# Patient Record
Sex: Male | Born: 1990 | Race: White | Hispanic: No | Marital: Married | Smoking: Never smoker
Health system: Southern US, Community
[De-identification: ages and names within clinical notes are randomized; demographics above are authoritative.]

## PROBLEM LIST (undated history)

## (undated) DIAGNOSIS — F419 Anxiety disorder, unspecified: Secondary | ICD-10-CM

## (undated) DIAGNOSIS — F32A Depression, unspecified: Secondary | ICD-10-CM

## (undated) DIAGNOSIS — I10 Essential (primary) hypertension: Secondary | ICD-10-CM

## (undated) DIAGNOSIS — E109 Type 1 diabetes mellitus without complications: Secondary | ICD-10-CM

## (undated) DIAGNOSIS — F909 Attention-deficit hyperactivity disorder, unspecified type: Secondary | ICD-10-CM

## (undated) DIAGNOSIS — E119 Type 2 diabetes mellitus without complications: Secondary | ICD-10-CM

## (undated) HISTORY — DX: Anxiety disorder, unspecified: F41.9

## (undated) HISTORY — DX: Attention-deficit hyperactivity disorder, unspecified type: F90.9

## (undated) HISTORY — DX: Depression, unspecified: F32.A

## (undated) HISTORY — DX: Type 1 diabetes mellitus without complications: E10.9

## (undated) HISTORY — DX: Essential (primary) hypertension: I10

---
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
## 2016-09-09 ENCOUNTER — Ambulatory Visit (HOSPITAL_COMMUNITY)
Admission: EM | Admit: 2016-09-09 | Discharge: 2016-09-09 | Disposition: A | Discharge status: Home / Self Care | Payer: PRIVATE HEALTH INSURANCE | Attending: Family Medicine | Admitting: Family Medicine

## 2016-09-09 ENCOUNTER — Ambulatory Visit (INDEPENDENT_AMBULATORY_CARE_PROVIDER_SITE_OTHER): Payer: PRIVATE HEALTH INSURANCE

## 2016-09-09 ENCOUNTER — Encounter (HOSPITAL_COMMUNITY): Payer: Self-pay | Admitting: *Deleted

## 2016-09-09 DIAGNOSIS — S93601A Unspecified sprain of right foot, initial encounter: Secondary | ICD-10-CM

## 2016-09-09 HISTORY — DX: Type 2 diabetes mellitus without complications: E11.9
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
## 2017-06-22 ENCOUNTER — Observation Stay (HOSPITAL_COMMUNITY): Payer: Medicaid Other

## 2017-06-22 ENCOUNTER — Emergency Department (HOSPITAL_COMMUNITY): Payer: Medicaid Other

## 2017-06-22 ENCOUNTER — Inpatient Hospital Stay (HOSPITAL_COMMUNITY)
Admission: EM | Admit: 2017-06-22 | Discharge: 2017-06-24 | DRG: 155 | Disposition: A | Discharge status: Home / Self Care | Payer: Medicaid Other | Attending: General Surgery | Admitting: General Surgery

## 2017-06-22 ENCOUNTER — Encounter (HOSPITAL_COMMUNITY): Payer: Self-pay | Admitting: Emergency Medicine

## 2017-06-22 DIAGNOSIS — M25552 Pain in left hip: Secondary | ICD-10-CM | POA: Diagnosis present

## 2017-06-22 DIAGNOSIS — E109 Type 1 diabetes mellitus without complications: Secondary | ICD-10-CM | POA: Diagnosis present

## 2017-06-22 DIAGNOSIS — S36030A Superficial (capsular) laceration of spleen, initial encounter: Secondary | ICD-10-CM | POA: Diagnosis present

## 2017-06-22 DIAGNOSIS — N179 Acute kidney failure, unspecified: Secondary | ICD-10-CM | POA: Diagnosis present

## 2017-06-22 DIAGNOSIS — S0121XA Laceration without foreign body of nose, initial encounter: Secondary | ICD-10-CM | POA: Diagnosis present

## 2017-06-22 DIAGNOSIS — S022XXA Fracture of nasal bones, initial encounter for closed fracture: Principal | ICD-10-CM | POA: Diagnosis present

## 2017-06-22 DIAGNOSIS — Z794 Long term (current) use of insulin: Secondary | ICD-10-CM

## 2017-06-22 DIAGNOSIS — S01111A Laceration without foreign body of right eyelid and periocular area, initial encounter: Secondary | ICD-10-CM | POA: Diagnosis present

## 2017-06-22 LAB — I-STAT CHEM 8, ED
BUN: 18 mg/dL (ref 6–20)
Calcium, Ion: 1.19 mmol/L (ref 1.15–1.40)
Chloride: 101 mmol/L (ref 101–111)
Creatinine, Ser: 1.2 mg/dL (ref 0.61–1.24)
Glucose, Bld: 83 mg/dL (ref 65–99)
HCT: 45 % (ref 39.0–52.0)
Hemoglobin: 15.3 g/dL (ref 13.0–17.0)
Potassium: 3.5 mmol/L (ref 3.5–5.1)
Sodium: 142 mmol/L (ref 135–145)
TCO2: 29 mmol/L (ref 22–32)

## 2017-06-22 LAB — CBG MONITORING, ED
GLUCOSE-CAPILLARY: 109 mg/dL — AB (ref 65–99)
Glucose-Capillary: 54 mg/dL — ABNORMAL LOW (ref 65–99)
Glucose-Capillary: 60 mg/dL — ABNORMAL LOW (ref 65–99)

## 2017-06-22 LAB — HEMOGLOBIN AND HEMATOCRIT, BLOOD
HCT: 43.1 % (ref 39.0–52.0)
Hemoglobin: 14.5 g/dL (ref 13.0–17.0)

## 2017-06-22 LAB — GLUCOSE, CAPILLARY
GLUCOSE-CAPILLARY: 317 mg/dL — AB (ref 65–99)
Glucose-Capillary: 183 mg/dL — ABNORMAL HIGH (ref 65–99)

## 2017-06-22 MED ORDER — PANTOPRAZOLE SODIUM 40 MG PO TBEC
40.0000 mg | DELAYED_RELEASE_TABLET | Freq: Every day | ORAL | Status: DC
  Administered 2017-06-23 – 2017-06-24 (×2): 40 mg via ORAL
  Filled 2017-06-22 (×2): qty 1

## 2017-06-22 MED ORDER — DEXTROSE 50 % IV SOLN
1.0000 | Freq: Once | INTRAVENOUS | Status: AC
  Administered 2017-06-22: 50 mL via INTRAVENOUS
  Filled 2017-06-22: qty 50

## 2017-06-22 MED ORDER — INSULIN ASPART 100 UNIT/ML ~~LOC~~ SOLN: 0.0000 [IU] | Freq: Three times a day (TID) | SUBCUTANEOUS | Status: DC

## 2017-06-22 MED ORDER — HYDROMORPHONE HCL 1 MG/ML IJ SOLN
1.0000 mg | Freq: Once | INTRAMUSCULAR | Status: AC
  Administered 2017-06-22: 1 mg via INTRAVENOUS
  Filled 2017-06-22: qty 1

## 2017-06-22 MED ORDER — KCL IN DEXTROSE-NACL 20-5-0.45 MEQ/L-%-% IV SOLN
INTRAVENOUS | Status: DC
  Administered 2017-06-22 – 2017-06-23 (×3): via INTRAVENOUS
  Filled 2017-06-22 (×5): qty 1000

## 2017-06-22 MED ORDER — ACETAMINOPHEN 325 MG PO TABS
650.0000 mg | ORAL_TABLET | ORAL | Status: DC | PRN
  Filled 2017-06-22: qty 2

## 2017-06-22 MED ORDER — OXYCODONE HCL 5 MG PO TABS
5.0000 mg | ORAL_TABLET | ORAL | Status: DC | PRN
  Administered 2017-06-22 – 2017-06-24 (×7): 10 mg via ORAL
  Filled 2017-06-22 (×7): qty 2

## 2017-06-22 MED ORDER — TETANUS-DIPHTH-ACELL PERTUSSIS 5-2.5-18.5 LF-MCG/0.5 IM SUSP
0.5000 mL | Freq: Once | INTRAMUSCULAR | Status: AC
  Administered 2017-06-22: 0.5 mL via INTRAMUSCULAR
  Filled 2017-06-22: qty 0.5

## 2017-06-22 MED ORDER — INSULIN ASPART 100 UNIT/ML ~~LOC~~ SOLN
0.0000 [IU] | Freq: Every day | SUBCUTANEOUS | Status: DC
  Administered 2017-06-22: 4 [IU] via SUBCUTANEOUS

## 2017-06-22 MED ORDER — BACITRACIN ZINC 500 UNIT/GM EX OINT
TOPICAL_OINTMENT | Freq: Two times a day (BID) | CUTANEOUS | Status: DC
  Administered 2017-06-22: 22:00:00 via TOPICAL
  Administered 2017-06-23: 1 via TOPICAL
  Administered 2017-06-23 – 2017-06-24 (×2): via TOPICAL
  Filled 2017-06-22 (×2): qty 28.35

## 2017-06-22 MED ORDER — IOPAMIDOL (ISOVUE-300) INJECTION 61%
INTRAVENOUS | Status: AC
  Administered 2017-06-22: 100 mL
  Filled 2017-06-22: qty 100

## 2017-06-22 MED ORDER — HYDROMORPHONE HCL 1 MG/ML IJ SOLN
0.5000 mg | INTRAMUSCULAR | Status: DC | PRN
  Administered 2017-06-22 – 2017-06-23 (×7): 0.5 mg via INTRAVENOUS
  Filled 2017-06-22 (×7): qty 1

## 2017-06-22 MED ORDER — DOCUSATE SODIUM 100 MG PO CAPS
100.0000 mg | ORAL_CAPSULE | Freq: Two times a day (BID) | ORAL | Status: DC
  Administered 2017-06-22 – 2017-06-24 (×4): 100 mg via ORAL
  Filled 2017-06-22 (×4): qty 1

## 2017-06-22 MED ORDER — ONDANSETRON HCL 4 MG/2ML IJ SOLN: 4.0000 mg | Freq: Four times a day (QID) | INTRAMUSCULAR | Status: DC | PRN

## 2017-06-22 MED ORDER — ONDANSETRON 4 MG PO TBDP: 4.0000 mg | ORAL_TABLET | Freq: Four times a day (QID) | ORAL | Status: DC | PRN

## 2017-06-22 MED ORDER — METOPROLOL TARTRATE 5 MG/5ML IV SOLN: 5.0000 mg | Freq: Four times a day (QID) | INTRAVENOUS | Status: DC | PRN

## 2017-06-22 MED ORDER — DEXTROSE 50 % IV SOLN
INTRAVENOUS | Status: AC
  Administered 2017-06-22: 25 mL
  Filled 2017-06-22: qty 50

## 2017-06-22 MED ORDER — LIDOCAINE HCL 2 % IJ SOLN
20.0000 mL | Freq: Once | INTRAMUSCULAR | Status: AC
  Administered 2017-06-22: 400 mg via INTRADERMAL
  Filled 2017-06-22: qty 20

## 2017-06-22 MED ORDER — PANTOPRAZOLE SODIUM 40 MG IV SOLR
40.0000 mg | Freq: Every day | INTRAVENOUS | Status: DC
  Administered 2017-06-22: 40 mg via INTRAVENOUS
  Filled 2017-06-22: qty 40

## 2017-06-22 MED ORDER — INSULIN ASPART 100 UNIT/ML ~~LOC~~ SOLN
0.0000 [IU] | Freq: Three times a day (TID) | SUBCUTANEOUS | Status: DC
  Administered 2017-06-23: 11 [IU] via SUBCUTANEOUS
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
## 2017-06-22 NOTE — ED Notes (Signed)
Dr. Leta Baptisthimmappa w/ ENT to [place] @ 1207 to [phone].

## 2017-06-22 NOTE — ED Notes (Signed)
Dr. Leta Baptisthimmappa w/ ENT repaged to [place] @ 1228 to [phone].

## 2017-06-22 NOTE — ED Notes (Signed)
Patient transported to xray then to 6 N
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
## 2017-06-22 NOTE — Progress Notes (Signed)
Pt asking for some insulin. MD paged requesting orders, no return call received at reporting time
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
## 2017-06-22 NOTE — ED Notes (Signed)
Patient states  He felt like his sugar was dropping , checked glucose 54 Shawn Kemp Lawyer PA aware orders for D50 W 1/2 amp given.
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
## 2017-06-23 DIAGNOSIS — Z794 Long term (current) use of insulin: Secondary | ICD-10-CM | POA: Diagnosis not present

## 2017-06-23 DIAGNOSIS — E109 Type 1 diabetes mellitus without complications: Secondary | ICD-10-CM | POA: Diagnosis present

## 2017-06-23 DIAGNOSIS — S0121XA Laceration without foreign body of nose, initial encounter: Secondary | ICD-10-CM | POA: Diagnosis present

## 2017-06-23 DIAGNOSIS — S01111A Laceration without foreign body of right eyelid and periocular area, initial encounter: Secondary | ICD-10-CM | POA: Diagnosis present

## 2017-06-23 DIAGNOSIS — N179 Acute kidney failure, unspecified: Secondary | ICD-10-CM | POA: Diagnosis present

## 2017-06-23 DIAGNOSIS — S022XXA Fracture of nasal bones, initial encounter for closed fracture: Secondary | ICD-10-CM | POA: Diagnosis not present

## 2017-06-23 DIAGNOSIS — M25552 Pain in left hip: Secondary | ICD-10-CM | POA: Diagnosis present

## 2017-06-23 DIAGNOSIS — S36030A Superficial (capsular) laceration of spleen, initial encounter: Secondary | ICD-10-CM | POA: Diagnosis present

## 2017-06-23 LAB — GLUCOSE, CAPILLARY
GLUCOSE-CAPILLARY: 221 mg/dL — AB (ref 65–99)
GLUCOSE-CAPILLARY: 222 mg/dL — AB (ref 65–99)
Glucose-Capillary: 320 mg/dL — ABNORMAL HIGH (ref 65–99)
Glucose-Capillary: 187 mg/dL — ABNORMAL HIGH (ref 65–99)
Glucose-Capillary: 138 mg/dL — ABNORMAL HIGH (ref 65–99)

## 2017-06-23 LAB — CBC
HEMATOCRIT: 41.8 % (ref 39.0–52.0)
Hemoglobin: 13.9 g/dL (ref 13.0–17.0)
MCH: 29.1 pg (ref 26.0–34.0)
MCHC: 33.3 g/dL (ref 30.0–36.0)
MCV: 87.4 fL (ref 78.0–100.0)
Platelets: 178 10*3/uL (ref 150–400)
RBC: 4.78 MIL/uL (ref 4.22–5.81)
RDW: 12.8 % (ref 11.5–15.5)
WBC: 8.8 10*3/uL (ref 4.0–10.5)

## 2017-06-23 LAB — BASIC METABOLIC PANEL
Anion gap: 10 (ref 5–15)
BUN: 9 mg/dL (ref 6–20)
CALCIUM: 8.9 mg/dL (ref 8.9–10.3)
CO2: 27 mmol/L (ref 22–32)
Chloride: 96 mmol/L — ABNORMAL LOW (ref 101–111)
Creatinine, Ser: 1.3 mg/dL — ABNORMAL HIGH (ref 0.61–1.24)
Glucose, Bld: 314 mg/dL — ABNORMAL HIGH (ref 65–99)
POTASSIUM: 4.1 mmol/L (ref 3.5–5.1)
Sodium: 133 mmol/L — ABNORMAL LOW (ref 135–145)

## 2017-06-23 LAB — HIV ANTIBODY (ROUTINE TESTING W REFLEX): HIV SCREEN 4TH GENERATION: NONREACTIVE

## 2017-06-23 MED ORDER — INSULIN GLARGINE 100 UNIT/ML ~~LOC~~ SOLN
26.0000 [IU] | Freq: Every day | SUBCUTANEOUS | Status: DC
  Administered 2017-06-23 – 2017-06-24 (×2): 26 [IU] via SUBCUTANEOUS
  Filled 2017-06-23 (×2): qty 0.26

## 2017-06-23 MED ORDER — INSULIN ASPART 100 UNIT/ML ~~LOC~~ SOLN
0.0000 [IU] | Freq: Three times a day (TID) | SUBCUTANEOUS | Status: DC
  Administered 2017-06-23: 3 [IU] via SUBCUTANEOUS
  Administered 2017-06-23: 2 [IU] via SUBCUTANEOUS
  Administered 2017-06-24: 5 [IU] via SUBCUTANEOUS

## 2017-06-23 MED ORDER — INSULIN ASPART 100 UNIT/ML ~~LOC~~ SOLN
4.0000 [IU] | Freq: Three times a day (TID) | SUBCUTANEOUS | Status: DC
  Administered 2017-06-23 – 2017-06-24 (×3): 4 [IU] via SUBCUTANEOUS

## 2017-06-23 NOTE — Progress Notes (Signed)
Inpatient Diabetes Program Recommendations  AACE/ADA: New Consensus Statement on Inpatient Glycemic Control (2015)  Target Ranges:  Prepandial:   less than 140 mg/dL      Peak postprandial:   less than 180 mg/dL (1-2 hours)      Critically ill patients:  140 - 180 mg/dL   Lab Results  Component Value Date   GLUCAP 320 (H) 06/23/2017    Review of Glycemic Control Results for Shawn Kemp, Shawn Kemp (MRN [id number]) as of 06/23/2017 10:00  Ref. Range 06/22/2017 09:10 06/22/2017 13:20 06/22/2017 17:59 06/22/2017 21:29 06/23/2017 07:50  Glucose-Capillary Latest Ref Range: 65 - 99 mg/dL 60 (Kemp) 409109 (H) 811183 (H) 317 (H) 320 (H)   Diabetes history: DM1 Outpatient Diabetes medications: Novolin 70/30 insulin mix 25 units ac breakfast + 15-25 units ac dinner Current orders for Inpatient glycemic control: Novolog Moderate scale tid + hs 0-5 units  Inpatient Diabetes Program Recommendations:   Spoke with patient and mom  @ bedside. Patient is type 1 and needs basal insulin. Patient does not currently have insurance and does not have a PCP either. Spoke with Wells GuilesKelly Rayburn, Trauma PA to discuss orders for patient. Plans to start Lantus 26 units qd Novolog 4 units tid meal coverage if eats 50% Decrease current Novolog correction scale to sensitive Case management [facility] needs. Patient states he normally takes his 70/30 insulin prior to driving to work and eating on his way to work. Recommend hospitalist consult.   Thank you, Billy FischerJudy E. Iram Astorino, RN, MSN, CDE  Diabetes Coordinator Inpatient Glycemic Control Team Team Pager [phone] (8am-5pm) 06/23/2017 10:11 AM
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
## 2017-06-23 NOTE — Progress Notes (Signed)
Tech offered the patient a bath and he stated he would like to wait until later on, maybe around noon, to wash up and just wants to rest currently. Tech will go back around noon to check in again.

## 2017-06-24 LAB — CBC
HCT: 41.4 % (ref 39.0–52.0)
HEMOGLOBIN: 14.2 g/dL (ref 13.0–17.0)
MCH: 29.9 pg (ref 26.0–34.0)
MCHC: 34.3 g/dL (ref 30.0–36.0)
MCV: 87.2 fL (ref 78.0–100.0)
PLATELETS: 155 10*3/uL (ref 150–400)
RBC: 4.75 MIL/uL (ref 4.22–5.81)
RDW: 12.8 % (ref 11.5–15.5)
WBC: 8.5 10*3/uL (ref 4.0–10.5)

## 2017-06-24 LAB — GLUCOSE, CAPILLARY: GLUCOSE-CAPILLARY: 288 mg/dL — AB (ref 65–99)

## 2017-06-24 MED ORDER — OXYCODONE HCL 10 MG PO TABS: 10.0000 mg | ORAL_TABLET | ORAL | 0 refills | Status: DC | PRN

## 2017-06-24 MED ORDER — ACETAMINOPHEN 325 MG PO TABS: 650.0000 mg | ORAL_TABLET | Freq: Four times a day (QID) | ORAL | Status: DC
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
## 2017-06-24 NOTE — Discharge Instructions (Signed)
Splenic Injury A splenic injury is an injury of the spleen. The spleen is an organ located in the upper left area of your abdomen, just under your ribs. Your spleen filters and cleans your blood. It also stores blood cells and destroys cells that are worn out. Your spleen is also important for fighting disease. Splenic injuries can vary. In some cases, the spleen may only be bruised with some bleeding inside the covering and around the spleen. Splenic injuries may also cause a deep tear or cut into the spleen (lacerated spleen). Some splenic injuries can cause the spleen to break open (rupture). What are the causes? Splenic injuries can be caused by a direct blow (blunt trauma) from:  Car accidents.  Contact sports.  Falls.  Gunshot wounds or knife wounds (penetrating injuries) can also cause a splenic injury. What increases the risk? You may be at greater risk for a splenic injury if you have a disease that can cause the spleen to become enlarged. These include:  Alcoholic liver disease.  Viral infections, especially mononucleosis.  What are the signs or symptoms? A minor splenic injury often causes no symptoms or only minor abdominal pain. If the injury causes severe bleeding, your blood pressure may rapidly decrease. This may cause:  Dizziness or light-headedness.  Rapid heart rate.  Difficulty breathing.  Fainting.  Sweating with clammy skin.  Other signs and symptoms of a splenic injury can include:  Very bad abdominal pain.  Pain in the left shoulder.  Pain when the abdomen is pressed (tenderness).  Nausea.  Swelling or bruising of the abdomen.  How is this diagnosed? Your health care provider may suspect a splenic injury based on your signs and symptoms, especially if you were recently in an accident or you recently got hurt. Your health care provider will do a physical exam. Imaging tests may be done to confirm the diagnosis. These may  include:  Ultrasound.  CT scan.  You may have frequent blood tests for a few days after the injury to monitor your condition. How is this treated? Treatment depends on the type of splenic injury you have and how bad it is. Your health care provider will develop a treatment plan specific to your needs.  Less severe injuries may be treated with: ? Observation. ? Interventional radiology. This involves using flexible tubes (catheters) to stop the bleeding from inside the blood vessel.  More severe injuries may require hospitalization in the intensive care unit (ICU). While you are in the ICU: ? Your fluid and blood levels will be monitored closely. ? You will get fluids through an IV tube as needed. ? You may need follow-up scans to check whether your spleen is able to heal itself. If the injury is getting worse, you may need surgery. ? You may receive donated blood (transfusion). ? You may have a long needle inserted into your abdomen to remove any blood that has collected inside the spleen (hematoma).  Surgery. If your blood pressure is too low, you may need emergency surgery. This may include: ? Repairing a laceration. ? Removing part of the spleen. ? Removing the entire spleen (splenectomy).  Follow these instructions at home:  Take medicines only as directed by your health care provider.  Rest at home.  Do not participate in any strenuous activity until your health care provider says it is safe to do so.  Do not lift anything that is heavier than 10 lb (4.5 kg).  Do not participate in contact sports  until your health care provider says it is safe to do so.  Stay up-to-date on vaccinations as told by your health care provider. Contact a health care provider if:  You have a fever.  You have new or increasing pain in your abdomen or in your left shoulder. Get help right away if:  You have signs or symptoms of internal bleeding. Watch  for: ? Sweating. ? Dizziness. ? Weakness. ? Cold and clammy skin. ? Fainting.  You have chest pain or difficulty breathing. This information is not intended to replace advice given to you by your health care provider. Make sure you discuss any questions you have with your health care provider. Document Released: 04/26/2006 Document Revised: 03/02/2016 Document Reviewed: 03/20/2014 Elsevier Interactive Patient Education  2017 [employer].  1. PAIN CONTROL:  1. Pain is best controlled by a usual combination of three different methods TOGETHER:  1. Ice/Heat 2. Over the counter pain medication 3. Prescription pain medication 2. Most patients will experience some swelling and bruising around wounds. Ice packs or heating pads (30-60 minutes up to 6 times a day) will help. Use ice for the first few days to help decrease swelling and bruising, then switch to heat to help relax tight/sore spots and speed recovery. Some people prefer to use ice alone, heat alone, alternating between ice & heat. Experiment to what works for you. Swelling and bruising can take several weeks to resolve.  3. It is helpful to take an over-the-counter pain medication regularly for the first few weeks. Choose one of the following that works best for you:  1. Naproxen (Aleve, etc) Two 220mg  tabs twice a day 2. Ibuprofen (Advil, etc) Three 200mg  tabs four times a day (every meal & bedtime) 3. Acetaminophen (Tylenol, etc) 500-650mg  four times a day (every meal & bedtime) 4. A prescription for pain medication (such as oxycodone, hydrocodone, etc) should be given to you upon discharge. Take your pain medication as prescribed.  1. If you are having problems/concerns with the prescription medicine (does not control pain, nausea, vomiting, rash, itching, etc), please call us [phone] to see if we need to switch you to a different pain medicine that will work better for you and/or control your side effect better. 2. If you  need a refill on your pain medication, please contact your pharmacy. They will contact our office to request authorization. Prescriptions will not be filled after 5 pm or on week-ends. 4. Avoid getting constipated. When taking pain medications, it is common to experience some constipation. Increasing fluid intake and taking a fiber supplement (such as Metamucil, Citrucel, FiberCon, MiraLax, etc) 1-2 times a day regularly will usually help prevent this problem from occurring. A mild laxative (prune juice, Milk of Magnesia, MiraLax, etc) should be taken according to package directions if there are no bowel movements after 48 hours.  5. Watch out for diarrhea. If you have many loose bowel movements, simplify your diet to bland foods & liquids for a few days. Stop any stool softeners and decrease your fiber supplement. Switching to mild anti-diarrheal medications (Kayopectate, Pepto Bismol) can help. If this worsens or does not improve, please call us. 6. Wash / shower every day. You may shower daily and replace your bandges after showering. No bathing or submerging your wounds in water until they heal. 7. FOLLOW UP in our office  1. Please call [facility] at [phone] as needed with any questions or concerns  WHEN TO CALL us [phone]:  1.  Poor pain control 2. Reactions / problems with new medications (rash/itching, nausea, etc)  3. Fever over 101.5 F (38.5 C) 4. Worsening swelling or bruising 5. Continued bleeding from wounds. 6. Increased pain, redness, or drainage from the wounds which could be signs of infection  The clinic staff is available to answer your questions during regular business hours (8:30am-5pm). Please dont hesitate to call and ask to speak to one of our nurses for clinical concerns.  If you have a medical emergency, go to the nearest emergency room or call 911.  A surgeon from [facility] is always on call at the [facility]   [facility] [place]  [address], [address], [place], [place] [address] ?  MAIN: [phone] ? TOLL FREE: [phone] ?  FAX [phone]  [email or web address]  Wash your face daily and apply bacitracin after.   Sutured Wound Care Sutures are stitches that can be used to close wounds. Taking care of your wound properly can help to prevent pain and infection. It can also help your wound to heal more quickly. How is this treated? Wound Care  Keep the wound clean and dry.  If you were given a bandage (dressing), you should change it at least once per day or as directed by your health care provider. You should also change it if it becomes wet or dirty.  Keep the wound completely dry for the first 24 hours or as directed by your health care provider. After that time, you may shower or bathe. However, make sure that the wound is not soaked in water until the sutures have been removed.  Clean the wound one time each day or as directed by your health care provider. ? Wash the wound with soap and water. ? Rinse the wound with water to remove all soap. ? Pat the wound dry with a clean towel. Do not rub the wound.  Aftercleaning the wound, apply a thin layer of antibioticointment as directed by your health care provider. This will help to prevent infection and keep the dressing from sticking to the wound.  Have the sutures removed as directed by your health care provider. General Instructions  Take or apply medicines only as directed by your health care provider.  To help prevent scarring, make sure to cover your wound with sunscreen whenever you are outside after the sutures are removed and the wound is healed. Make sure to wear a sunscreen of at least 30 SPF.  If you were prescribed an antibiotic medicine or ointment, finish all of it even if you start to feel better.  Do not scratch or pick at the wound.  Keep all follow-up visits as directed by your health care provider. This is  important.  Check your wound every day for signs of infection. Watch for: ? Redness, swelling, or pain. ? Fluid, blood, or pus.  Raise (elevate) the injured area above the level of your heart while you are sitting or lying down, if possible.  Avoid stretching your wound.  Drink enough fluids to keep your urine clear or pale yellow. Contact a health care provider if:  You received a tetanus shot and you have swelling, severe pain, redness, or bleeding at the injection site.  You have a fever.  A wound that was closed breaks open.  You notice a bad smell coming from the wound.  You notice something coming out of the wound, such as wood or glass.  Your pain is not controlled  with medicine.  You have increased redness, swelling, or pain at the site of your wound.  You have fluid, blood, or pus coming from your wound.  You notice a change in the color of your skin near your wound.  You need to change the dressing frequently due to fluid, blood, or pus draining from the wound.  You develop a new rash.  You develop numbness around the wound. Get help right away if:  You develop severe swelling around the injury site.  Your pain suddenly increases and is severe.  You develop painful lumps near the wound or on skin that is anywhere on your body.  You have a red streak going away from your wound.  The wound is on your hand or foot and you cannot properly move a finger or toe.  The wound is on your hand or foot and you notice that your fingers or toes look pale or bluish. This information is not intended to replace advice given to you by your health care provider. Make sure you discuss any questions you have with your health care provider. Document Released: 08/12/2004 Document Revised: 12/11/2015 Document Reviewed: 02/14/2013 Elsevier Interactive Patient Education  2017 [employer]

## 2017-06-24 NOTE — Progress Notes (Signed)
Patient discharged to home with instructions and prescription. 

## 2017-06-24 NOTE — Progress Notes (Signed)
Inpatient Diabetes Program Recommendations  AACE/ADA: New Consensus Statement on Inpatient Glycemic Control (2015)  Target Ranges:  Prepandial:   less than 140 mg/dL      Peak postprandial:   less than 180 mg/dL (1-2 hours)      Critically ill patients:  140 - 180 mg/dL   Lab Results  Component Value Date   GLUCAP 288 (H) 06/24/2017    Review of Glycemic Control  Inpatient Diabetes Program Recommendations:   Reviewed CBGs. Please increase: -Lantus to 30 units daily -Novolog meal coverage to 5 units tid if eats 50%  Thank you, Darel HongJudy E. Peggie Hornak, RN, MSN, CDE  Diabetes Coordinator Inpatient Glycemic Control Team Team Pager [phone] (8am-5pm) 06/24/2017 8:35 AM]
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
## 2019-11-22 DIAGNOSIS — I10 Essential (primary) hypertension: Secondary | ICD-10-CM | POA: Diagnosis not present

## 2019-11-22 DIAGNOSIS — E109 Type 1 diabetes mellitus without complications: Secondary | ICD-10-CM | POA: Diagnosis not present

## 2019-11-22 DIAGNOSIS — Z6834 Body mass index (BMI) 34.0-34.9, adult: Secondary | ICD-10-CM | POA: Diagnosis not present

## 2019-11-22 DIAGNOSIS — E7849 Other hyperlipidemia: Secondary | ICD-10-CM | POA: Diagnosis not present

## 2020-01-31 DIAGNOSIS — E7849 Other hyperlipidemia: Secondary | ICD-10-CM | POA: Diagnosis not present

## 2020-01-31 DIAGNOSIS — I10 Essential (primary) hypertension: Secondary | ICD-10-CM | POA: Diagnosis not present

## 2020-01-31 DIAGNOSIS — E109 Type 1 diabetes mellitus without complications: Secondary | ICD-10-CM | POA: Diagnosis not present

## 2020-03-13 ENCOUNTER — Ambulatory Visit: Payer: PRIVATE HEALTH INSURANCE | Admitting: Orthopaedic Surgery

## 2020-03-13 ENCOUNTER — Other Ambulatory Visit: Payer: Self-pay

## 2020-03-26 ENCOUNTER — Ambulatory Visit: Payer: Self-pay | Admitting: "Endocrinology

## 2020-04-23 DIAGNOSIS — E7849 Other hyperlipidemia: Secondary | ICD-10-CM | POA: Diagnosis not present

## 2020-04-23 DIAGNOSIS — Z6832 Body mass index (BMI) 32.0-32.9, adult: Secondary | ICD-10-CM | POA: Diagnosis not present

## 2020-04-23 DIAGNOSIS — E109 Type 1 diabetes mellitus without complications: Secondary | ICD-10-CM | POA: Diagnosis not present

## 2020-04-23 DIAGNOSIS — I10 Essential (primary) hypertension: Secondary | ICD-10-CM | POA: Diagnosis not present

## 2020-04-25 DIAGNOSIS — E109 Type 1 diabetes mellitus without complications: Secondary | ICD-10-CM | POA: Diagnosis not present

## 2020-04-25 DIAGNOSIS — Z79899 Other long term (current) drug therapy: Secondary | ICD-10-CM | POA: Diagnosis not present

## 2020-04-25 DIAGNOSIS — E7849 Other hyperlipidemia: Secondary | ICD-10-CM | POA: Diagnosis not present

## 2020-05-22 DIAGNOSIS — Z0000 Encounter for general adult medical examination without abnormal findings: Secondary | ICD-10-CM | POA: Diagnosis not present

## 2020-05-22 DIAGNOSIS — Z6831 Body mass index (BMI) 31.0-31.9, adult: Secondary | ICD-10-CM | POA: Diagnosis not present

## 2020-09-09 DIAGNOSIS — E109 Type 1 diabetes mellitus without complications: Secondary | ICD-10-CM | POA: Diagnosis not present

## 2020-09-09 DIAGNOSIS — Z0000 Encounter for general adult medical examination without abnormal findings: Secondary | ICD-10-CM | POA: Diagnosis not present

## 2020-09-09 DIAGNOSIS — M25519 Pain in unspecified shoulder: Secondary | ICD-10-CM | POA: Diagnosis not present

## 2020-09-09 DIAGNOSIS — I10 Essential (primary) hypertension: Secondary | ICD-10-CM | POA: Diagnosis not present

## 2020-09-09 DIAGNOSIS — E7849 Other hyperlipidemia: Secondary | ICD-10-CM | POA: Diagnosis not present

## 2020-10-02 ENCOUNTER — Other Ambulatory Visit: Payer: Self-pay

## 2020-10-02 ENCOUNTER — Ambulatory Visit (INDEPENDENT_AMBULATORY_CARE_PROVIDER_SITE_OTHER): Payer: BC Managed Care – PPO

## 2020-10-02 ENCOUNTER — Ambulatory Visit (INDEPENDENT_AMBULATORY_CARE_PROVIDER_SITE_OTHER): Payer: BC Managed Care – PPO | Admitting: Orthopaedic Surgery

## 2020-10-02 DIAGNOSIS — M25512 Pain in left shoulder: Secondary | ICD-10-CM | POA: Diagnosis not present
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
## 2020-11-16 DIAGNOSIS — E119 Type 2 diabetes mellitus without complications: Secondary | ICD-10-CM | POA: Diagnosis not present

## 2020-11-16 DIAGNOSIS — H40033 Anatomical narrow angle, bilateral: Secondary | ICD-10-CM | POA: Diagnosis not present

## 2020-12-04 ENCOUNTER — Telehealth: Payer: Self-pay | Admitting: Radiology

## 2020-12-04 DIAGNOSIS — M25512 Pain in left shoulder: Secondary | ICD-10-CM

## 2020-12-04 NOTE — Telephone Encounter (Signed)
PT referral entered and faxed. I left voicemail for patient advising.

## 2020-12-04 NOTE — Telephone Encounter (Signed)
Yes proceed thanks

## 2020-12-04 NOTE — Telephone Encounter (Signed)
Patient was going to be referred to PT in March for subscap strengthening.  He called [facility] office today stating that he was never referred and would like referral to go to [facility] in [place]. OK for referral?

## 2020-12-08 DIAGNOSIS — M6281 Muscle weakness (generalized): Secondary | ICD-10-CM | POA: Diagnosis not present

## 2020-12-08 DIAGNOSIS — S43082A Other subluxation of left shoulder joint, initial encounter: Secondary | ICD-10-CM | POA: Diagnosis not present

## 2020-12-11 ENCOUNTER — Telehealth: Payer: Self-pay | Admitting: Radiology

## 2020-12-11 NOTE — Telephone Encounter (Signed)
Received call from [place], [place]. She received order for subscap strengthening of shoulder and wanted to see if you wanted over all shoulder strengthening as well. Office note only states subscap strengthening so I did not give verbal for overall shoulder strengthening.  Misty Stanley aware you are out of the office until next week, and asks that we return call then in regards to order.  OK for shoulder strengthening as well?  CB for [place] [phone]

## 2020-12-16 NOTE — Telephone Encounter (Signed)
I called discussed.  

## 2021-01-07 DIAGNOSIS — M25519 Pain in unspecified shoulder: Secondary | ICD-10-CM | POA: Diagnosis not present

## 2021-01-07 DIAGNOSIS — E1121 Type 2 diabetes mellitus with diabetic nephropathy: Secondary | ICD-10-CM | POA: Diagnosis not present

## 2021-01-07 DIAGNOSIS — E1169 Type 2 diabetes mellitus with other specified complication: Secondary | ICD-10-CM | POA: Diagnosis not present

## 2021-01-07 DIAGNOSIS — I10 Essential (primary) hypertension: Secondary | ICD-10-CM | POA: Diagnosis not present

## 2021-02-21 DIAGNOSIS — M10062 Idiopathic gout, left knee: Secondary | ICD-10-CM | POA: Diagnosis not present

## 2021-02-21 DIAGNOSIS — M25462 Effusion, left knee: Secondary | ICD-10-CM | POA: Diagnosis not present

## 2021-03-09 DIAGNOSIS — E1121 Type 2 diabetes mellitus with diabetic nephropathy: Secondary | ICD-10-CM | POA: Diagnosis not present

## 2021-03-20 DIAGNOSIS — E1121 Type 2 diabetes mellitus with diabetic nephropathy: Secondary | ICD-10-CM | POA: Diagnosis not present

## 2021-03-20 DIAGNOSIS — M25519 Pain in unspecified shoulder: Secondary | ICD-10-CM | POA: Diagnosis not present

## 2021-03-20 DIAGNOSIS — I10 Essential (primary) hypertension: Secondary | ICD-10-CM | POA: Diagnosis not present

## 2021-03-20 DIAGNOSIS — E7849 Other hyperlipidemia: Secondary | ICD-10-CM | POA: Diagnosis not present

## 2021-03-31 DIAGNOSIS — S43421A Sprain of right rotator cuff capsule, initial encounter: Secondary | ICD-10-CM | POA: Diagnosis not present

## 2021-04-09 DIAGNOSIS — R1013 Epigastric pain: Secondary | ICD-10-CM | POA: Diagnosis not present

## 2021-05-28 DIAGNOSIS — Z6827 Body mass index (BMI) 27.0-27.9, adult: Secondary | ICD-10-CM | POA: Diagnosis not present

## 2021-05-28 DIAGNOSIS — Z0000 Encounter for general adult medical examination without abnormal findings: Secondary | ICD-10-CM | POA: Diagnosis not present

## 2021-07-06 DIAGNOSIS — J029 Acute pharyngitis, unspecified: Secondary | ICD-10-CM | POA: Diagnosis not present

## 2021-07-18 DIAGNOSIS — H04123 Dry eye syndrome of bilateral lacrimal glands: Secondary | ICD-10-CM | POA: Diagnosis not present

## 2021-07-18 DIAGNOSIS — H40033 Anatomical narrow angle, bilateral: Secondary | ICD-10-CM | POA: Diagnosis not present

## 2021-09-09 DIAGNOSIS — S233XXA Sprain of ligaments of thoracic spine, initial encounter: Secondary | ICD-10-CM | POA: Diagnosis not present

## 2021-09-09 DIAGNOSIS — S338XXA Sprain of other parts of lumbar spine and pelvis, initial encounter: Secondary | ICD-10-CM | POA: Diagnosis not present

## 2021-09-09 DIAGNOSIS — S134XXA Sprain of ligaments of cervical spine, initial encounter: Secondary | ICD-10-CM | POA: Diagnosis not present

## 2021-09-11 DIAGNOSIS — S134XXA Sprain of ligaments of cervical spine, initial encounter: Secondary | ICD-10-CM | POA: Diagnosis not present

## 2021-09-11 DIAGNOSIS — S338XXA Sprain of other parts of lumbar spine and pelvis, initial encounter: Secondary | ICD-10-CM | POA: Diagnosis not present

## 2021-09-11 DIAGNOSIS — S233XXA Sprain of ligaments of thoracic spine, initial encounter: Secondary | ICD-10-CM | POA: Diagnosis not present

## 2021-11-06 DIAGNOSIS — I10 Essential (primary) hypertension: Secondary | ICD-10-CM | POA: Diagnosis not present

## 2021-11-06 DIAGNOSIS — Z0000 Encounter for general adult medical examination without abnormal findings: Secondary | ICD-10-CM | POA: Diagnosis not present

## 2021-11-06 DIAGNOSIS — R5383 Other fatigue: Secondary | ICD-10-CM | POA: Diagnosis not present

## 2021-11-06 LAB — CBC AND DIFFERENTIAL
HCT: 44 (ref 41–53)
Hemoglobin: 15 (ref 13.5–17.5)
Platelets: 200 10*3/uL (ref 150–400)
WBC: 5.3

## 2021-11-06 LAB — LIPID PANEL
Cholesterol: 192 (ref 0–200)
HDL: 49 (ref 35–70)
LDL Cholesterol: 133
Triglycerides: 55 (ref 40–160)

## 2021-11-06 LAB — COMPREHENSIVE METABOLIC PANEL
Albumin: 4.5 (ref 3.5–5.0)
Calcium: 9.5 (ref 8.7–10.7)
Globulin: 2.3
eGFR: 95

## 2021-11-06 LAB — BASIC METABOLIC PANEL
BUN: 20 (ref 4–21)
CO2: 22 (ref 13–22)
Chloride: 104 (ref 99–108)
Creatinine: 1.1 (ref 0.6–1.3)
Glucose: 157
Potassium: 4.6 mEq/L (ref 3.5–5.1)
Sodium: 140 (ref 137–147)

## 2021-11-06 LAB — VITAMIN D 25 HYDROXY (VIT D DEFICIENCY, FRACTURES): Vit D, 25-Hydroxy: 33.9

## 2021-11-06 LAB — CBC: RBC: 4.98 (ref 3.87–5.11)

## 2021-11-06 LAB — HEPATIC FUNCTION PANEL
ALT: 35 U/L (ref 10–40)
AST: 39 (ref 14–40)
Alkaline Phosphatase: 100 (ref 25–125)

## 2021-11-06 LAB — TSH: TSH: 1.06 (ref 0.41–5.90)

## 2021-11-06 LAB — HEMOGLOBIN A1C: Hemoglobin A1C: 7.6

## 2021-12-22 DIAGNOSIS — E1165 Type 2 diabetes mellitus with hyperglycemia: Secondary | ICD-10-CM | POA: Diagnosis not present

## 2022-03-07 DIAGNOSIS — R1084 Generalized abdominal pain: Secondary | ICD-10-CM | POA: Diagnosis not present

## 2022-03-07 DIAGNOSIS — K5900 Constipation, unspecified: Secondary | ICD-10-CM | POA: Diagnosis not present

## 2022-03-07 DIAGNOSIS — R300 Dysuria: Secondary | ICD-10-CM | POA: Diagnosis not present

## 2022-03-10 DIAGNOSIS — E1165 Type 2 diabetes mellitus with hyperglycemia: Secondary | ICD-10-CM | POA: Diagnosis not present

## 2022-03-20 DIAGNOSIS — Z0000 Encounter for general adult medical examination without abnormal findings: Secondary | ICD-10-CM | POA: Diagnosis not present

## 2022-03-20 DIAGNOSIS — E1165 Type 2 diabetes mellitus with hyperglycemia: Secondary | ICD-10-CM | POA: Diagnosis not present

## 2022-03-20 LAB — BASIC METABOLIC PANEL
BUN: 22 — AB (ref 4–21)
CO2: 27 — AB (ref 13–22)
Chloride: 103 (ref 99–108)
Creatinine: 1.4 — AB (ref 0.6–1.3)
Glucose: 216
Potassium: 4.4 mEq/L (ref 3.5–5.1)
Sodium: 136 — AB (ref 137–147)

## 2022-03-20 LAB — COMPREHENSIVE METABOLIC PANEL
Albumin: 4.7 (ref 3.5–5.0)
Calcium: 9.8 (ref 8.7–10.7)
Globulin: 2.4
eGFR: 10

## 2022-03-20 LAB — HEPATIC FUNCTION PANEL
ALT: 37 U/L (ref 10–40)
AST: 32 (ref 14–40)

## 2022-03-20 LAB — HEMOGLOBIN A1C: Hemoglobin A1C: 6.7

## 2022-03-26 DIAGNOSIS — E1165 Type 2 diabetes mellitus with hyperglycemia: Secondary | ICD-10-CM | POA: Diagnosis not present

## 2022-03-31 DIAGNOSIS — S233XXA Sprain of ligaments of thoracic spine, initial encounter: Secondary | ICD-10-CM | POA: Diagnosis not present

## 2022-03-31 DIAGNOSIS — S338XXA Sprain of other parts of lumbar spine and pelvis, initial encounter: Secondary | ICD-10-CM | POA: Diagnosis not present

## 2022-03-31 DIAGNOSIS — S134XXA Sprain of ligaments of cervical spine, initial encounter: Secondary | ICD-10-CM | POA: Diagnosis not present

## 2022-04-01 DIAGNOSIS — S338XXA Sprain of other parts of lumbar spine and pelvis, initial encounter: Secondary | ICD-10-CM | POA: Diagnosis not present

## 2022-04-01 DIAGNOSIS — S134XXA Sprain of ligaments of cervical spine, initial encounter: Secondary | ICD-10-CM | POA: Diagnosis not present

## 2022-04-01 DIAGNOSIS — S233XXA Sprain of ligaments of thoracic spine, initial encounter: Secondary | ICD-10-CM | POA: Diagnosis not present

## 2022-04-02 DIAGNOSIS — S233XXA Sprain of ligaments of thoracic spine, initial encounter: Secondary | ICD-10-CM | POA: Diagnosis not present

## 2022-04-02 DIAGNOSIS — S338XXA Sprain of other parts of lumbar spine and pelvis, initial encounter: Secondary | ICD-10-CM | POA: Diagnosis not present

## 2022-04-02 DIAGNOSIS — S134XXA Sprain of ligaments of cervical spine, initial encounter: Secondary | ICD-10-CM | POA: Diagnosis not present

## 2022-07-02 DIAGNOSIS — E1165 Type 2 diabetes mellitus with hyperglycemia: Secondary | ICD-10-CM | POA: Diagnosis not present

## 2022-09-16 ENCOUNTER — Encounter: Payer: Self-pay | Admitting: Adult Health

## 2022-09-16 ENCOUNTER — Ambulatory Visit (INDEPENDENT_AMBULATORY_CARE_PROVIDER_SITE_OTHER): Payer: Medicaid Other | Admitting: Adult Health

## 2022-09-16 VITALS — BP 135/78 | HR 78 | Temp 98.0°F | Resp 18 | Ht 67.0 in | Wt 198.1 lb

## 2022-09-16 DIAGNOSIS — E109 Type 1 diabetes mellitus without complications: Secondary | ICD-10-CM | POA: Diagnosis not present

## 2022-09-16 DIAGNOSIS — F419 Anxiety disorder, unspecified: Secondary | ICD-10-CM | POA: Diagnosis not present

## 2022-09-16 DIAGNOSIS — I10 Essential (primary) hypertension: Secondary | ICD-10-CM

## 2022-09-16 MED ORDER — DEXCOM G6 SENSOR MISC: 3 refills | Status: DC

## 2022-09-16 MED ORDER — DEXCOM G6 TRANSMITTER MISC: 1.0000 | 3 refills | Status: DC

## 2022-09-16 MED ORDER — NOVOLOG 100 UNIT/ML IJ SOLN: 10.0000 [IU] | Freq: Three times a day (TID) | INTRAMUSCULAR | 3 refills | Status: DC

## 2022-09-16 MED ORDER — BUPROPION HCL 75 MG PO TABS: 75.0000 mg | ORAL_TABLET | Freq: Every day | ORAL | 3 refills | Status: DC

## 2022-09-16 MED ORDER — BENAZEPRIL HCL 40 MG PO TABS: 40.0000 mg | ORAL_TABLET | Freq: Every day | ORAL | 3 refills | Status: DC

## 2022-09-16 MED ORDER — SEMGLEE (YFGN) 100 UNIT/ML ~~LOC~~ SOLN: 25.0000 [IU] | Freq: Every day | SUBCUTANEOUS | 3 refills | Status: DC

## 2022-09-16 NOTE — Progress Notes (Signed)
[facility]  Provider: Durenda Age DNP  Code Status:  Full Code  Goals of Care:      No data to display           Chief Complaint  Patient presents with   Acute Visit    Medication refill. Patient states took blood sugar been high around 300's    HPI: Patient is a 32 y.o. male seen today for an acute visit for diabetic medication refills. He was diagnosed with IDDM since age 36 years old. He is wanting prescription for his medications.   Insulin dependent diabetes mellitus type IA ([place]) - takes Semeglee and Novolog  Essential hypertension - takes Lotensin, BP 135/78  Anxiety - takes Wellbutrin   Past Medical History:  Diagnosis Date   Diabetes mellitus without complication ([place])     History reviewed. No pertinent surgical history.  No Known Allergies  Outpatient Encounter Medications as of 09/16/2022  Medication Sig   acetaminophen (TYLENOL) 325 MG tablet Take 2 tablets (650 mg total) by mouth every 6 (six) hours.   benazepril (LOTENSIN) 40 MG tablet Take 40 mg by mouth daily.   buPROPion (WELLBUTRIN) 75 MG tablet Take 75 mg by mouth daily.   Continuous Blood Gluc Sensor (DEXCOM G6 SENSOR) MISC SMARTSIG:Topical Every 10 Days   Continuous Blood Gluc Transmit (DEXCOM G6 TRANSMITTER) MISC See admin instructions.   insulin NPH-regular Human (NOVOLIN 70/30) (70-30) 100 UNIT/ML injection Inject 25 Units into the skin daily with breakfast.    NOVOLOG 100 UNIT/ML injection SMARTSIG:5-20 Unit(s) SUB-Q 3 Times Daily   oxyCODONE 10 MG TABS Take 1 tablet (10 mg total) by mouth every 4 (four) hours as needed for moderate pain or severe pain.   RELION INSULIN SYRINGE 1ML/31G 31G X 5/16" 1 ML MISC See admin instructions.   SEMGLEE, YFGN, 100 UNIT/ML injection SMARTSIG:50 Unit(s) SUB-Q Daily   No facility-administered encounter medications on file as of 09/16/2022.    Review of Systems:  Review of Systems  Constitutional:  Negative for activity change, appetite  change and fever.  HENT:  Negative for sore throat.   Eyes: Negative.   Cardiovascular:  Negative for chest pain and leg swelling.  Gastrointestinal:  Negative for abdominal distention, diarrhea and vomiting.  Genitourinary:  Negative for dysuria, frequency and urgency.  Skin:  Negative for color change.  Neurological:  Negative for dizziness and headaches.  Psychiatric/Behavioral:  Negative for behavioral problems and sleep disturbance. The patient is not nervous/anxious.     Health Maintenance  Topic Date Due   Hepatitis C Screening  Never done   COVID-19 Vaccine (1) 10/02/2022 (Originally 08/04/1990)   INFLUENZA VACCINE  10/17/2022 (Originally 02/16/2022)   DTaP/Tdap/Td (3 - Td or Tdap) 06/23/2027   HIV Screening  Completed   HPV VACCINES  Aged Out    Physical Exam: Vitals:   09/16/22 1551  BP: 135/78  Pulse: 78  Resp: 18  Temp: 98 F (36.7 C)  SpO2: 98%  Weight: 198 lb 2 oz (89.9 kg)  Height: '5\' 7"'$  (1.702 m)   Body mass index is 31.03 kg/m. Physical Exam Constitutional:      General: He is not in acute distress.    Appearance: Normal appearance.  HENT:     Head: Normocephalic and atraumatic.     Mouth/Throat:     Mouth: Mucous membranes are moist.  Eyes:     Conjunctiva/sclera: Conjunctivae normal.  Cardiovascular:     Rate and Rhythm: Normal rate and regular rhythm.  Pulses: Normal pulses.     Heart sounds: Normal heart sounds.  Pulmonary:     Effort: Pulmonary effort is normal.     Breath sounds: Normal breath sounds.  Abdominal:     General: Bowel sounds are normal.     Palpations: Abdomen is soft.  Musculoskeletal:        General: No swelling. Normal range of motion.     Cervical back: Normal range of motion.  Skin:    General: Skin is warm and dry.  Neurological:     General: No focal deficit present.     Mental Status: He is alert and oriented to person, place, and time.  Psychiatric:        Mood and Affect: Mood normal.        Behavior:  Behavior normal.        Thought Content: Thought content normal.        Judgment: Judgment normal.     Labs reviewed: Basic Metabolic Panel: No results for input(s): "NA", "K", "CL", "CO2", "GLUCOSE", "BUN", "CREATININE", "CALCIUM", "MG", "PHOS", "TSH" in the last 8760 hours. Liver Function Tests: No results for input(s): "AST", "ALT", "ALKPHOS", "BILITOT", "PROT", "ALBUMIN" in the last 8760 hours. No results for input(s): "LIPASE", "AMYLASE" in the last 8760 hours. No results for input(s): "AMMONIA" in the last 8760 hours. CBC: No results for input(s): "WBC", "NEUTROABS", "HGB", "HCT", "MCV", "PLT" in the last 8760 hours. Lipid Panel: No results for input(s): "CHOL", "HDL", "LDLCALC", "TRIG", "CHOLHDL", "LDLDIRECT" in the last 8760 hours. No results found for: "HGBA1C"  Procedures since last visit: No results found.  Assessment/Plan  1. Insulin dependent diabetes mellitus type IA ([facility]) - NOVOLOG 100 UNIT/ML injection; Inject 10 Units into the skin 3 (three) times daily before meals.  Dispense: 10 mL; Refill: 3 - SEMGLEE, YFGN, 100 UNIT/ML injection; Inject 0.25 mLs (25 Units total) into the skin at bedtime.  Dispense: 10 mL; Refill: 3 - Continuous Blood Gluc Sensor (DEXCOM G6 SENSOR) MISC; SMARTSIG:Topical Every 10 Days  Dispense: 3 each; Refill: 3 - Continuous Blood Gluc Transmit (DEXCOM G6 TRANSMITTER) MISC; Inject 1 Device into the skin See admin instructions.  Dispense: 1 each; Refill: 3  2. Essential hypertension -  check BP, log and bring to next appointment - benazepril (LOTENSIN) 40 MG tablet; Take 1 tablet (40 mg total) by mouth daily.  Dispense: 30 tablet; Refill: 3  3. Anxiety -  mood is stable - buPROPion (WELLBUTRIN) 75 MG tablet; Take 1 tablet (75 mg total) by mouth daily.  Dispense: 30 tablet; Refill: 3    Labs/tests ordered:  None  Next appt:  09/24/2022

## 2022-09-16 NOTE — Patient Instructions (Signed)
Type 1 Diabetes Mellitus, Diagnosis, Adult  Type 1 diabetes mellitus, or type 1 diabetes, is a long-term (chronic) disease. It happens when the cells in the pancreas that make a hormone called insulin are destroyed. Normally, insulin lets blood sugar (glucose) enter cells in your body. This gives you energy. If you have type 1 diabetes, glucose cannot get into cells. It builds up in the blood instead. This causes high blood glucose (hyperglycemia). There is no cure for type 1 diabetes. Treatment can help you manage your condition. What are the causes? The exact cause of type 1 diabetes is not known. What increases the risk? You may be more likely to have type 1 diabetes if a family member has it as well. You may also be more at risk if: You have a gene for type 1 diabetes that was passed down to you from a parent (inherited). You have an autoimmune disorder. This means that your body's disease-fighting system (immune system) attacks your body. You have been exposed to certain viruses. You live in an area with cold weather. What are the signs or symptoms? Symptoms may begin slowly over days or weeks. They may also start all of a sudden. Symptoms may include: Increased thirst or hunger. Needing to pee (urinate) more often or peeing more at night. Sudden weight changes that you cannot explain. Tiredness (fatigue) or weakness. Vision changes. These may include blurry vision. How is this diagnosed? Type 1 diabetes is diagnosed based on your symptoms, your medical history, a physical exam, and your blood glucose level. Your blood glucose may be checked with: A fasting blood glucose (FBG) test. You will not be allowed to eat (you will fast) for 8 hours or more before a blood sample is taken. A random blood glucose test. This test checks your blood glucose at any time of day no matter when you last ate. A hemoglobin A1C (A1C) blood test. This shows what your blood glucose levels have been over the  last 2-3 months. You may be diagnosed with type 1 diabetes if: Your FBG level is 126 mg/dL (7 mmol/L) or higher. Your random blood glucose level is 200 mg/dL (11.1 mmol/L) or higher. Your A1C level is 6.5% or higher. These blood tests may be done more than once. You may also need other blood tests. How is this treated? You may work with an expert called an endocrinologist to find ways to manage your diabetes. You should also follow instructions from your health care provider. You may need to: Take insulin every day. This helps to keep your blood glucose levels in the right range. Take medicines to help prevent problems from diabetes. You may need to take: Aspirin. Medicine to lower your cholesterol. Medicine to control your blood pressure. Check your blood glucose as often as told. Make diet and lifestyle changes. You may be told to: Follow a nutrition plan that has been made just for you by a dietitian. Get regular exercise. Find ways to manage stress. Your provider will set treatment goals that are right for you. These goals will be based on your age, other conditions you have, and how you respond to treatment. In general, your A1C level should be less than 7% and your blood glucose levels should be: 80-130 mg/dL (4.4-7.2 mmol/L) before meals (preprandial). Below 180 mg/dL (10 mmol/L) after meals (postprandial). Follow these instructions at home: Questions to ask your health care provider You may want to ask your provider: Do I need to meet with a certified  diabetes care and education specialist? Should I join a support group for people with diabetes? What equipment will I need to manage my diabetes at home? What diabetes medicines should I take, and when? How often should I check my blood glucose? What number should I call if I have questions? When is my next appointment? General instructions Take over-the-counter and prescription medicines only as told by your provider. Keep all  follow-up visits. You will need regular blood tests to make sure the treatments are working. Your provider may adjust your medicines based on your test results. Where to find more information American Diabetes Association (ADA): diabetes.org Association of Diabetes Care and Education Specialists (ADCES): diabeteseducator.org International Diabetes Federation (IDF): https://www.munoz-bell.org/ Contact a health care provider if: Your blood glucose level is higher than 240 mg/dL (13.3 mmol/L) for 2 days in a row. You have been sick or have had a fever for 2 days or more, and you are not getting better. For more than 6 hours, you: Cannot eat or drink. Have nausea and vomiting. Have diarrhea. Get help right away if: Your blood glucose is less than 54 mg/dL (3 mmol/L). You become confused, or you have trouble thinking clearly. You have trouble breathing. These symptoms may be an emergency. Get help right away. Call 911. Do not wait to see if the symptoms will go away. Do not drive yourself to the hospital. This information is not intended to replace advice given to you by your health care provider. Make sure you discuss any questions you have with your health care provider. Document Revised: 03/08/2022 Document Reviewed: 03/08/2022 Elsevier Patient Education  [place].

## 2022-09-21 ENCOUNTER — Other Ambulatory Visit: Payer: Self-pay

## 2022-09-21 MED ORDER — FREESTYLE LIBRE 3 READER DEVI: 1.0000 | 0 refills | Status: DC

## 2022-09-21 MED ORDER — FREESTYLE LIBRE 3 SENSOR MISC: 1.0000 | 3 refills | Status: DC

## 2022-09-21 NOTE — Addendum Note (Signed)
Addended by: Carroll Kinds on: 09/21/2022 09:25 AM   Modules accepted: Orders

## 2022-09-21 NOTE — Progress Notes (Signed)
I tried to start PA,but had incorrect insurance information I called patient to get correct insurance information. He informed me that he had medicaid and not blue cross blue shield. I then tried again to start PA,but couldn't find the correct form to use on covermymeds. I contacted the pharmacy to get the PA information and was informed by the pharmacist the prior authorization had gone through and prescription had been picked up.

## 2022-09-21 NOTE — Progress Notes (Addendum)
Patient called stating that he called last week for prior authorization for dexcom,but decided to switch to [facility] 3. There was no documentation of prior authorization being started for wither one. Prior authorization started today for [employer] 3. Patient was informed that it may take up to 72 hours for a decision to be made whether it is approved or denied.

## 2022-09-24 ENCOUNTER — Ambulatory Visit: Payer: Medicaid Other | Admitting: Adult Health

## 2022-10-07 ENCOUNTER — Ambulatory Visit (INDEPENDENT_AMBULATORY_CARE_PROVIDER_SITE_OTHER): Payer: Medicaid Other | Admitting: Adult Health

## 2022-10-07 ENCOUNTER — Encounter: Payer: Self-pay | Admitting: Adult Health

## 2022-10-07 VITALS — BP 112/74 | HR 74 | Temp 97.9°F | Resp 16 | Ht 67.0 in | Wt 198.0 lb

## 2022-10-07 DIAGNOSIS — F419 Anxiety disorder, unspecified: Secondary | ICD-10-CM

## 2022-10-07 DIAGNOSIS — Z113 Encounter for screening for infections with a predominantly sexual mode of transmission: Secondary | ICD-10-CM

## 2022-10-07 DIAGNOSIS — Z7689 Persons encountering health services in other specified circumstances: Secondary | ICD-10-CM

## 2022-10-07 DIAGNOSIS — I10 Essential (primary) hypertension: Secondary | ICD-10-CM | POA: Diagnosis not present

## 2022-10-07 DIAGNOSIS — B354 Tinea corporis: Secondary | ICD-10-CM

## 2022-10-07 DIAGNOSIS — E109 Type 1 diabetes mellitus without complications: Secondary | ICD-10-CM | POA: Diagnosis not present

## 2022-10-07 DIAGNOSIS — Z3009 Encounter for other general counseling and advice on contraception: Secondary | ICD-10-CM

## 2022-10-07 MED ORDER — BENAZEPRIL HCL 20 MG PO TABS: 20.0000 mg | ORAL_TABLET | Freq: Every day | ORAL | 1 refills | Status: DC

## 2022-10-07 MED ORDER — TOLNAFTATE 1 % EX CREA: 1.0000 | TOPICAL_CREAM | Freq: Two times a day (BID) | CUTANEOUS | 0 refills | Status: AC

## 2022-10-07 NOTE — Patient Instructions (Signed)
Preventive Care 21-33 Years Old, Male Preventive care refers to lifestyle choices and visits with your health care provider that can promote health and wellness. Preventive care visits are also called wellness exams. What can I expect for my preventive care visit? Counseling During your preventive care visit, your health care provider may ask about your: Medical history, including: Past medical problems. Family medical history. Current health, including: Emotional well-being. Home life and relationship well-being. Sexual activity. Lifestyle, including: Alcohol, nicotine or tobacco, and drug use. Access to firearms. Diet, exercise, and sleep habits. Safety issues such as seatbelt and bike helmet use. Sunscreen use. Work and work environment. Physical exam Your health care provider may check your: Height and weight. These may be used to calculate your BMI (body mass index). BMI is a measurement that tells if you are at a healthy weight. Waist circumference. This measures the distance around your waistline. This measurement also tells if you are at a healthy weight and may help predict your risk of certain diseases, such as type 2 diabetes and high blood pressure. Heart rate and blood pressure. Body temperature. Skin for abnormal spots. What immunizations do I need?  Vaccines are usually given at various ages, according to a schedule. Your health care provider will recommend vaccines for you based on your age, medical history, and lifestyle or other factors, such as travel or where you work. What tests do I need? Screening Your health care provider may recommend screening tests for certain conditions. This may include: Lipid and cholesterol levels. Diabetes screening. This is done by checking your blood sugar (glucose) after you have not eaten for a while (fasting). Hepatitis B test. Hepatitis C test. HIV (human immunodeficiency virus) test. STI (sexually transmitted infection)  testing, if you are at risk. Talk with your health care provider about your test results, treatment options, and if necessary, the need for more tests. Follow these instructions at home: Eating and drinking  Eat a healthy diet that includes fresh fruits and vegetables, whole grains, lean protein, and low-fat dairy products. Drink enough fluid to keep your urine pale yellow. Take vitamin and mineral supplements as recommended by your health care provider. Do not drink alcohol if your health care provider tells you not to drink. If you drink alcohol: Limit how much you have to 0-2 drinks a day. Know how much alcohol is in your drink. In the [place] one drink equals one 12 oz bottle of beer (355 mL), one 5 oz glass of wine (148 mL), or one 1 oz glass of hard liquor (44 mL). Lifestyle Brush your teeth every morning and night with fluoride toothpaste. Floss one time each day. Exercise for at least 30 minutes 5 or more days each week. Do not use any products that contain nicotine or tobacco. These products include cigarettes, chewing tobacco, and vaping devices, such as e-cigarettes. If you need help quitting, ask your health care provider. Do not use drugs. If you are sexually active, practice safe sex. Use a condom or other form of protection to prevent STIs. Find healthy ways to manage stress, such as: Meditation, yoga, or listening to music. Journaling. Talking to a trusted person. Spending time with friends and family. Minimize exposure to UV radiation to reduce your risk of skin cancer. Safety Always wear your seat belt while driving or riding in a vehicle. Do not drive: If you have been drinking alcohol. Do not ride with someone who has been drinking. If you have been using any mind-altering substances   or drugs. While texting. When you are tired or distracted. Wear a helmet and other protective equipment during sports activities. If you have firearms in your house, make sure you  follow all gun safety procedures. Seek help if you have been physically or sexually abused. What's next? Go to your health care provider once a year for an annual wellness visit. Ask your health care provider how often you should have your eyes and teeth checked. Stay up to date on all vaccines. This information is not intended to replace advice given to you by your health care provider. Make sure you discuss any questions you have with your health care provider. Document Revised: 12/31/2020 Document Reviewed: 12/31/2020 Elsevier Patient Education  2023 Elsevier Inc.  

## 2022-10-07 NOTE — Progress Notes (Signed)
[facility]  Provider:  Durenda Age DNP  Code Status:  Full Code  Goals of Care:     10/07/2022    9:20 AM  Advanced Directives  Does Patient Have a Medical Advance Directive? No     Chief Complaint  Patient presents with   New Patient (Initial Visit)    Patient is here to establish care.   Quality Metric Gaps    Discussed the need for diabetic kidney eval, hep c screening    HPI: Patient is a 32 y.o. male seen today to establish care with [place]. He works as a Nurse, mental health. He is married and has 3 sons and wife is [redacted] weeks pregnant with twin girls. He is currently taking Semeglee and Novolog for IDDM. BP today 112/74. He stated that sometimes he feels dizzy and thinks it is due to hypotension. He currently takes Benazepril 40 mg daily for hypertension. He denies anxiety, takes Wellbutrin for anxiety. He has a pruritic circular erythematous rashes on his right jaw. He said that his 45 year old son was diagnosed with ringworm and he does wrestling with him. He has had it for 2 weeks. He is requesting to be referred to urology for family planning. He is requesting a DMV medical form to be completed.   Past Medical History:  Diagnosis Date   Diabetes mellitus without complication ([place])     History reviewed. No pertinent surgical history.  No Known Allergies  Outpatient Encounter Medications as of 10/07/2022  Medication Sig   benazepril (LOTENSIN) 20 MG tablet Take 1 tablet (20 mg total) by mouth daily.   buPROPion (WELLBUTRIN) 75 MG tablet Take 1 tablet (75 mg total) by mouth daily.   Continuous Blood Gluc Receiver (FREESTYLE LIBRE 3 READER) DEVI 1 each by Does not apply route as directed. DX: E10.9   NOVOLOG 100 UNIT/ML injection Inject 10 Units into the skin 3 (three) times daily before meals.   RELION INSULIN SYRINGE 1ML/31G 31G X 5/16" 1 ML MISC See admin instructions.   SEMGLEE, YFGN, 100 UNIT/ML injection Inject 0.25 mLs (25 Units total) into the skin at  bedtime.   tolnaftate (TINACTIN) 1 % cream Apply 1 Application topically 2 (two) times daily.   [DISCONTINUED] benazepril (LOTENSIN) 40 MG tablet Take 1 tablet (40 mg total) by mouth daily.   Continuous Blood Gluc Sensor (FREESTYLE LIBRE 3 SENSOR) MISC 1 Device by Does not apply route every 14 (fourteen) days for 14 days. Place 1 sensor on the skin every 14 days. Use to check glucose continuously DX: E10.9   No facility-administered encounter medications on file as of 10/07/2022.    Review of Systems:  Review of Systems  Constitutional:  Negative for activity change, appetite change and fever.  HENT:  Negative for sore throat.   Eyes: Negative.   Cardiovascular:  Negative for chest pain and leg swelling.  Gastrointestinal:  Negative for abdominal distention, diarrhea and vomiting.  Genitourinary:  Negative for dysuria, frequency and urgency.  Skin:  Negative for color change.  Neurological:  Negative for dizziness and headaches.  Psychiatric/Behavioral:  Negative for behavioral problems and sleep disturbance. The patient is not nervous/anxious.     Health Maintenance  Topic Date Due   Diabetic kidney evaluation - Urine ACR  Never done   Hepatitis C Screening  Never done   Diabetic kidney evaluation - eGFR measurement  06/23/2018   INFLUENZA VACCINE  10/17/2022 (Originally 02/16/2022)   COVID-19 Vaccine (1) 10/23/2022 (Originally 08/04/1990)  DTaP/Tdap/Td (3 - Td or Tdap) 06/23/2027   HIV Screening  Completed   HPV VACCINES  Aged Out    Physical Exam: Vitals:   10/07/22 0921  BP: 112/74  Pulse: 74  Resp: 16  Temp: 97.9 F (36.6 C)  TempSrc: Temporal  SpO2: 93%  Weight: 198 lb (89.8 kg)  Height: 5\' 7"  (1.702 m)   Body mass index is 31.01 kg/m. Physical Exam Constitutional:      Appearance: Normal appearance.  HENT:     Head: Normocephalic and atraumatic.     Mouth/Throat:     Mouth: Mucous membranes are moist.  Eyes:     Conjunctiva/sclera: Conjunctivae normal.   Cardiovascular:     Rate and Rhythm: Normal rate and regular rhythm.     Pulses: Normal pulses.     Heart sounds: Normal heart sounds.  Pulmonary:     Effort: Pulmonary effort is normal.     Breath sounds: Normal breath sounds.  Abdominal:     General: Bowel sounds are normal.     Palpations: Abdomen is soft.  Musculoskeletal:        General: No swelling. Normal range of motion.     Cervical back: Normal range of motion.  Skin:    General: Skin is warm and dry.     Findings: Erythema and rash present.     Comments: Right jaw line circular rashes  Neurological:     General: No focal deficit present.     Mental Status: He is alert and oriented to person, place, and time.  Psychiatric:        Mood and Affect: Mood normal.        Behavior: Behavior normal.        Thought Content: Thought content normal.        Judgment: Judgment normal.     Labs reviewed: Basic Metabolic Panel: No results for input(s): "NA", "K", "CL", "CO2", "GLUCOSE", "BUN", "CREATININE", "CALCIUM", "MG", "PHOS", "TSH" in the last 8760 hours. Liver Function Tests: No results for input(s): "AST", "ALT", "ALKPHOS", "BILITOT", "PROT", "ALBUMIN" in the last 8760 hours. No results for input(s): "LIPASE", "AMYLASE" in the last 8760 hours. No results for input(s): "AMMONIA" in the last 8760 hours. CBC: No results for input(s): "WBC", "NEUTROABS", "HGB", "HCT", "MCV", "PLT" in the last 8760 hours. Lipid Panel: No results for input(s): "CHOL", "HDL", "LDLCALC", "TRIG", "CHOLHDL", "LDLDIRECT" in the last 8760 hours. No results found for: "HGBA1C"  Procedures since last visit: No results found.  Assessment/Plan  1. Encounter to establish care -  establish care with [place]  2. Insulin dependent diabetes mellitus type IA ([place]) -  continue Semeglee and Novolog -  continue continuous glucose monitoring FreeStyle Libre 3 - CBC With Differential/Platelet; Future - Hemoglobin A1C; Future - Lipid panel; Future -  Microalbumin/Creatinine Ratio, Urine; Future  3. Essential hypertension -  BP 112/74 -  has been dizzy at times, will decrease Benazepril from 40 mg daily to 20 mg daily -  check BP, log and bring to next appointment - benazepril (LOTENSIN) 20 MG tablet; Take 1 tablet (20 mg total) by mouth daily.  Dispense: 30 tablet; Refill: 1 - Complete Metabolic Panel with eGFR; Future  4. Anxiety -  stable, continue Wellbutrin  5. Screening examination for STD (sexually transmitted disease) - Hep C Antibody; Future  6. Tinea corporis - tolnaftate (TINACTIN) 1 % cream; Apply 1 Application topically 2 (two) times daily.  Dispense: 60 g; Refill: 0  7. Family planning -  Ambulatory referral to Urology   Labs/tests ordered:  Hep C, CBC, CMP, lipid panel, A1c, urine microalbumin  Next appt:  in a month

## 2022-10-08 ENCOUNTER — Ambulatory Visit: Payer: Medicaid Other | Admitting: Family

## 2022-10-08 ENCOUNTER — Other Ambulatory Visit: Payer: Medicaid Other

## 2022-10-08 DIAGNOSIS — E109 Type 1 diabetes mellitus without complications: Secondary | ICD-10-CM

## 2022-10-08 DIAGNOSIS — Z113 Encounter for screening for infections with a predominantly sexual mode of transmission: Secondary | ICD-10-CM

## 2022-10-08 DIAGNOSIS — I10 Essential (primary) hypertension: Secondary | ICD-10-CM

## 2022-10-08 LAB — CBC WITH DIFFERENTIAL/PLATELET
Absolute Monocytes: 646 cells/uL (ref 200–950)
Eosinophils Absolute: 182 cells/uL (ref 15–500)
Eosinophils Relative: 2 %
Monocytes Relative: 7.1 %
Neutro Abs: 6525 cells/uL (ref 1500–7800)
Neutrophils Relative %: 71.7 %

## 2022-10-09 LAB — LIPID PANEL
HDL: 61 mg/dL (ref >=40)
Total CHOL/HDL Ratio: 2.4 (calc) (ref <5.0)
Triglycerides: 40 mg/dL (ref <150)

## 2022-10-09 LAB — CBC WITH DIFFERENTIAL/PLATELET
Basophils Absolute: 64 cells/uL (ref 0–200)
Basophils Relative: 0.7 %
Lymphs Abs: 1684 cells/uL (ref 850–3900)
MCH: 30.4 pg (ref 27.0–33.0)
MCV: 90.1 fL (ref 80.0–100.0)
MPV: 9.6 fL (ref 7.5–12.5)
Platelets: 260 10*3/uL (ref 140–400)
Total Lymphocyte: 18.5 %
WBC: 9.1 10*3/uL (ref 3.8–10.8)

## 2022-10-09 LAB — COMPLETE METABOLIC PANEL WITH GFR
AG Ratio: 2.3 (calc) (ref 1.0–2.5)
ALT: 26 U/L (ref 9–46)
Creat: 1.33 mg/dL — ABNORMAL HIGH (ref 0.60–1.26)
Glucose, Bld: 41 mg/dL — ABNORMAL LOW (ref 65–99)
Sodium: 143 mmol/L (ref 135–146)
Total Bilirubin: 1.4 mg/dL — ABNORMAL HIGH (ref 0.2–1.2)
eGFR: 73 mL/min/{1.73_m2} (ref >=60)

## 2022-10-09 LAB — MICROALBUMIN / CREATININE URINE RATIO: Microalb Creat Ratio: 3 mg/g creat (ref <30)

## 2022-10-11 LAB — CBC WITH DIFFERENTIAL/PLATELET
HCT: 44.7 % (ref 38.5–50.0)
Hemoglobin: 15.1 g/dL (ref 13.2–17.1)
MCHC: 33.8 g/dL (ref 32.0–36.0)
RBC: 4.96 10*6/uL (ref 4.20–5.80)
RDW: 12.1 % (ref 11.0–15.0)

## 2022-10-11 LAB — COMPLETE METABOLIC PANEL WITH GFR
AST: 28 U/L (ref 10–40)
Albumin: 4.6 g/dL (ref 3.6–5.1)
Alkaline phosphatase (APISO): 83 U/L (ref 36–130)
BUN/Creatinine Ratio: 17 (calc) (ref 6–22)
BUN: 22 mg/dL (ref 7–25)
CO2: 27 mmol/L (ref 20–32)
Calcium: 9.5 mg/dL (ref 8.6–10.3)
Chloride: 104 mmol/L (ref 98–110)
Globulin: 2 g/dL (calc) (ref 1.9–3.7)
Potassium: 3.7 mmol/L (ref 3.5–5.3)
Total Protein: 6.6 g/dL (ref 6.1–8.1)

## 2022-10-11 LAB — LIPID PANEL
Cholesterol: 147 mg/dL (ref <200)
LDL Cholesterol (Calc): 75 mg/dL (calc)
Non-HDL Cholesterol (Calc): 86 mg/dL (calc) (ref <130)

## 2022-10-11 LAB — MICROALBUMIN / CREATININE URINE RATIO
Creatinine, Urine: 149 mg/dL (ref 20–320)
Microalb, Ur: 0.4 mg/dL

## 2022-10-11 LAB — HEMOGLOBIN A1C
Hgb A1c MFr Bld: 7.3 % of total Hgb — ABNORMAL HIGH (ref <5.7)
Mean Plasma Glucose: 163 mg/dL
eAG (mmol/L): 9 mmol/L

## 2022-10-11 LAB — HEPATITIS C ANTIBODY: Hepatitis C Ab: NONREACTIVE

## 2022-10-12 ENCOUNTER — Other Ambulatory Visit: Payer: Self-pay | Admitting: Adult Health

## 2022-10-12 ENCOUNTER — Other Ambulatory Visit: Payer: Self-pay

## 2022-10-12 DIAGNOSIS — E109 Type 1 diabetes mellitus without complications: Secondary | ICD-10-CM

## 2022-10-12 MED ORDER — NOVOLOG 100 UNIT/ML IJ SOLN: 6.0000 [IU] | Freq: Three times a day (TID) | INTRAMUSCULAR | 3 refills | Status: DC

## 2022-10-12 NOTE — Progress Notes (Signed)
-    glucose 41,  will need to decrease Novolog from 10 units to 6 units SQ 3X/day. Follow up in office in 2 weeks for diabetes management. -  A1c 7.3, slightly elevated -  CBC, electrolytes, urine microalbumin and lipid panel within normal and hep c is negative

## 2022-10-25 ENCOUNTER — Ambulatory Visit: Payer: Medicaid Other | Admitting: Adult Health

## 2022-10-28 ENCOUNTER — Encounter: Payer: Medicaid Other | Admitting: Adult Health

## 2022-10-28 NOTE — Progress Notes (Signed)
This encounter was created in error - please disregard.

## 2022-11-11 ENCOUNTER — Ambulatory Visit (INDEPENDENT_AMBULATORY_CARE_PROVIDER_SITE_OTHER): Payer: Medicaid Other | Admitting: Adult Health

## 2022-11-11 ENCOUNTER — Encounter: Payer: Self-pay | Admitting: Adult Health

## 2022-11-11 VITALS — BP 122/80 | HR 75 | Temp 97.4°F | Resp 18 | Ht 67.0 in | Wt 207.2 lb

## 2022-11-11 DIAGNOSIS — L03115 Cellulitis of right lower limb: Secondary | ICD-10-CM | POA: Diagnosis not present

## 2022-11-11 DIAGNOSIS — R238 Other skin changes: Secondary | ICD-10-CM | POA: Diagnosis not present

## 2022-11-11 DIAGNOSIS — F419 Anxiety disorder, unspecified: Secondary | ICD-10-CM

## 2022-11-11 DIAGNOSIS — E109 Type 1 diabetes mellitus without complications: Secondary | ICD-10-CM

## 2022-11-11 DIAGNOSIS — I10 Essential (primary) hypertension: Secondary | ICD-10-CM | POA: Diagnosis not present

## 2022-11-11 MED ORDER — CEFADROXIL 500 MG PO CAPS: 500.0000 mg | ORAL_CAPSULE | Freq: Two times a day (BID) | ORAL | 0 refills | Status: AC

## 2022-11-11 NOTE — Patient Instructions (Signed)

## 2022-11-11 NOTE — Progress Notes (Signed)
[facility]  Provider:  Kenard Gower DNP  Code Status:   Goals of Care:     10/07/2022    9:20 AM  Advanced Directives  Does Patient Have a Medical Advance Directive? No     Chief Complaint  Patient presents with   Follow-up    1 month f/u and follow up on diabeties management   Quality Metric Gaps    Needs to discuss     HPI: Patient is a 32 y.o. male seen today for medical management of chronic diseases. He was scratched by fingernails during a jujitsu match. Noted to have dry erythematous abrasion which measures 10 cm. He has noticed a bump at the end of it 2 days ago. He denies chills nor fever. He has a red papule on the right side of his nose which was there since 2018. 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 No pertinent surgical history.  No Known Allergies  Outpatient Encounter Medications as of 11/11/2022  Medication Sig   benazepril (LOTENSIN) 20 MG tablet Take 1 tablet (20 mg total) by mouth daily.   buPROPion (WELLBUTRIN) 75 MG tablet Take 1 tablet (75 mg total) by mouth daily.   Continuous Blood Gluc Receiver (FREESTYLE LIBRE 3 READER) DEVI 1 each by Does not apply route as directed. DX: E10.9   NOVOLOG 100 UNIT/ML injection Inject 6 Units into the skin 3 (three) times daily before meals.   RELION INSULIN SYRINGE 1ML/31G 31G X 5/16" 1 ML MISC See admin instructions.   SEMGLEE, YFGN, 100 UNIT/ML injection Inject 0.25 mLs (25 Units total) into the skin at bedtime.   Continuous Blood Gluc Sensor (FREESTYLE LIBRE 3  SENSOR) MISC 1 Device by Does not apply route every 14 (fourteen) days for 14 days. Place 1 sensor on the skin every 14 days. Use to check glucose continuously DX: E10.9   No facility-administered encounter medications on file as of 11/11/2022.    Review of Systems:  Review of Systems  Constitutional:  Negative for activity change, appetite change and fever.  HENT:  Negative for sore throat.   Eyes: Negative.   Cardiovascular:  Negative for chest pain and leg swelling.  Gastrointestinal:  Negative for abdominal distention, diarrhea and vomiting.  Genitourinary:  Negative for dysuria, frequency and urgency.  Skin:  Negative for color change.  Neurological:  Negative for dizziness and headaches.  Psychiatric/Behavioral:  Negative for behavioral problems and sleep disturbance. The patient is not nervous/anxious.     Health Maintenance  Topic Date Due   COVID-19 Vaccine (1) Never done   INFLUENZA VACCINE  02/17/2023   Diabetic kidney evaluation - eGFR measurement  10/08/2023   Diabetic kidney evaluation - Urine ACR  10/08/2023   DTaP/Tdap/Td (3 - Td or Tdap) 06/23/2027   Hepatitis C Screening  Completed   HIV Screening  Completed   HPV VACCINES  Aged Out    Physical Exam: Vitals:   11/11/22 0839  BP: 122/80  Pulse:  75  Resp: 18  Temp: (!) 97.4 F (36.3 C)  SpO2: 94%  Weight: 207 lb 4 oz (94 kg)  Height: 5\' 7"  (1.702 m)   Body mass index is 32.46 kg/m. Physical Exam Constitutional:      Appearance: He is obese.  HENT:     Head: Normocephalic and atraumatic.     Mouth/Throat:     Mouth: Mucous membranes are moist.  Eyes:     Conjunctiva/sclera: Conjunctivae normal.  Cardiovascular:     Rate and Rhythm: Normal rate and regular rhythm.     Pulses: Normal pulses.     Heart sounds: Normal heart sounds.  Pulmonary:     Effort: Pulmonary effort is normal.     Breath sounds: Normal breath sounds.  Abdominal:     General: Bowel sounds are normal.     Palpations: Abdomen  is soft.  Musculoskeletal:        General: No swelling. Normal range of motion.     Cervical back: Normal range of motion.  Skin:    General: Skin is warm and dry.     Comments: Right lateral thigh abrasion, erythematous Fed papule on left nose  Neurological:     General: No focal deficit present.     Mental Status: He is alert and oriented to person, place, and time.  Psychiatric:        Mood and Affect: Mood normal.        Behavior: Behavior normal.        Thought Content: Thought content normal.        Judgment: Judgment normal.     Labs reviewed: Basic Metabolic Panel: Recent Labs    10/08/22 0900  NA 143  K 3.7  CL 104  CO2 27  GLUCOSE 41*  BUN 22  CREATININE 1.33*  CALCIUM 9.5   Liver Function Tests: Recent Labs    10/08/22 0900  AST 28  ALT 26  BILITOT 1.4*  PROT 6.6   No results for input(s): "LIPASE", "AMYLASE" in the last 8760 hours. No results for input(s): "AMMONIA" in the last 8760 hours. CBC: Recent Labs    10/08/22 0900  WBC 9.1  NEUTROABS 6,525  HGB 15.1  HCT 44.7  MCV 90.1  PLT 260   Lipid Panel: Recent Labs    10/08/22 0900  CHOL 147  HDL 61  LDLCALC 75  TRIG 40  CHOLHDL 2.4   Lab Results  Component Value Date   HGBA1C 7.3 (H) 10/08/2022    Procedures since last visit: No results found.  Assessment/Plan  1. Cellulitis of right lower extremity -  keep skin clean and dry -  monitor for drainage - cefadroxil (DURICEF) 500 MG capsule; Take 1 capsule (500 mg total) by mouth 2 (two) times daily for 7 days.  Dispense: 14 capsule; Refill: 0  2. Papule -  bled occasionally - Ambulatory referral to Dermatology  3. 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 Essential hypertension -  BPs stable -  continue Benazepril  5. Anxiety -  mood is stable -  continue Wellbutrin    Labs/tests ordered:   None  Next appt:  Visit date not found

## 2022-12-12 ENCOUNTER — Other Ambulatory Visit: Payer: Self-pay | Admitting: Adult Health

## 2022-12-12 DIAGNOSIS — I10 Essential (primary) hypertension: Secondary | ICD-10-CM

## 2022-12-19 ENCOUNTER — Other Ambulatory Visit: Payer: Self-pay | Admitting: Adult Health

## 2022-12-21 ENCOUNTER — Encounter: Payer: Self-pay | Admitting: Urology

## 2022-12-21 ENCOUNTER — Ambulatory Visit (INDEPENDENT_AMBULATORY_CARE_PROVIDER_SITE_OTHER): Payer: Medicaid Other | Admitting: Urology

## 2022-12-21 VITALS — BP 127/74 | HR 74

## 2022-12-21 DIAGNOSIS — Z3009 Encounter for other general counseling and advice on contraception: Secondary | ICD-10-CM

## 2022-12-21 NOTE — Progress Notes (Signed)
H&P  Chief Complaint: Vasectomy consultation  History of Present Illness: 32 year old male comes in today for vasectomy consultation.  He and his wife Wyn Forster have 3 children together and she is pregnant with twins.  She is at 31 to 32 weeks.  He works with [employer]  Past Medical History:  Diagnosis Date   Diabetes mellitus without complication (HCC)     No past surgical history on file.  Home Medications:  Allergies as of 12/21/2022   No Known Allergies      Medication List        Accurate as of December 21, 2022  8:40 AM. If you have any questions, ask your nurse or doctor.          benazepril 20 MG tablet Commonly known as: LOTENSIN Take 1 tablet by mouth once daily   buPROPion 75 MG tablet Commonly known as: WELLBUTRIN Take 1 tablet (75 mg total) by mouth daily.   FreeStyle Libre 3 Reader [place] 1 each by Does not apply route as directed. DX: E10.9   FreeStyle Libre 3 Sensor Misc APPLY 1 PATCH TOPICALLY EVERY 14 DAYS. USE TO CHECK GLUCOSE CONTINUOUSLY   NovoLOG 100 UNIT/ML injection Generic drug: insulin aspart Inject 6 Units into the skin 3 (three) times daily before meals.   RELION INSULIN SYRINGE 1ML/31G 31G X 5/16" 1 ML Misc Generic drug: Insulin Syringe-Needle U-100 See admin instructions.   Semglee (yfgn) 100 UNIT/ML injection Generic drug: insulin glargine-yfgn Inject 0.25 mLs (25 Units total) into the skin at bedtime.        Allergies: No Known Allergies  Family History  Problem Relation Age of Onset   Hyperlipidemia Mother    Hypertension Father    Heart Problems Father    Healthy Brother    Heart Problems Child    Heart Problems Son     Social History:  reports that he has never smoked. He does not have any smokeless tobacco history on file. He reports that he does not drink alcohol. No history on file for drug use.  ROS: A complete review of systems was performed.  All systems are negative except for pertinent findings as  noted.  Physical Exam:  Vital signs in last 24 hours: There were no vitals taken for this visit. Constitutional:  Alert and oriented, No acute distress Cardiovascular: Regular rate  Respiratory: Normal respiratory effort Genitourinary: Normal male phallus, testes are descended bilaterally and non-tender and without masses, scrotum is normal in appearance without lesions or masses, perineum is normal on inspection. Lymphatic: No lymphadenopathy Neurologic: Grossly intact, no focal deficits Psychiatric: Normal mood and affect    Impression/Assessment:  Vasectomy consultation  Plan:  1. We discussed the procedure in detail, and he was provided a vasectomy information packet. He understands that this is a permanent form of sterilization  2. We discussed that vasectomy does not produce immediate infertility and he will need at least one semen sample showing no sperm before he can discontinue contraception. This typically takes about 3 months  3. We discussed that there is a less than 1 in 1000 failure rate  4. I advised him to refrain from intercourse for approximately 1 week  5. There is less than a 1% chance of a postop scrotal hematoma or chronic scrotal pain  6. We will schedule vasectomy at his convenience

## 2022-12-27 ENCOUNTER — Telehealth: Payer: Self-pay

## 2022-12-27 NOTE — Telephone Encounter (Signed)
PA request received for [facility] 3 sensor. PA initiated through covermy meds and waiting on response.

## 2023-01-12 ENCOUNTER — Other Ambulatory Visit: Payer: Self-pay | Admitting: Adult Health

## 2023-01-12 DIAGNOSIS — I10 Essential (primary) hypertension: Secondary | ICD-10-CM

## 2023-01-13 NOTE — Telephone Encounter (Signed)
PA approved 12/27/2022-06/28/2023

## 2023-01-24 ENCOUNTER — Telehealth: Payer: Self-pay

## 2023-01-24 NOTE — Telephone Encounter (Signed)
Patient is calling in regards to the [facility] prescription. He states that the insurance will not cover that medication but will cover Lantus. He states that he is willing to try whatever but you have to write a new prescription.

## 2023-01-24 NOTE — Telephone Encounter (Signed)
Can he come for a visit with his blood sugar log so we can discuss his diabetes regimen?

## 2023-01-25 NOTE — Telephone Encounter (Signed)
I have called and spoke with patient and scheduled him for a Mychart to discuss Diabetes regimen. Patient twins were just born so he need tme at home

## 2023-01-28 ENCOUNTER — Telehealth (INDEPENDENT_AMBULATORY_CARE_PROVIDER_SITE_OTHER): Payer: Medicaid Other | Admitting: Adult Health

## 2023-01-28 ENCOUNTER — Encounter: Payer: Self-pay | Admitting: Adult Health

## 2023-01-28 DIAGNOSIS — I10 Essential (primary) hypertension: Secondary | ICD-10-CM

## 2023-01-28 DIAGNOSIS — F419 Anxiety disorder, unspecified: Secondary | ICD-10-CM

## 2023-01-28 DIAGNOSIS — E109 Type 1 diabetes mellitus without complications: Secondary | ICD-10-CM

## 2023-01-28 MED ORDER — LANTUS SOLOSTAR 100 UNIT/ML ~~LOC~~ SOPN
25.0000 [IU] | PEN_INJECTOR | Freq: Every day | SUBCUTANEOUS | 99 refills | Status: DC
Start: 2023-01-28 — End: 2023-05-11

## 2023-01-28 NOTE — Progress Notes (Signed)
This service is provided via telemedicine  No vital signs collected/recorded due to the encounter was a telemedicine visit.   Location of patient (ex: home, work):  Home   Patient consents to a video visit:  Yes, 01/28/2023   Location of the provider (ex: office, home):  [facility]  Name of any referring provider:  Medina-Vargas, Margit Banda, NP   Names of all persons participating in the telemedicine service and their role in the encounter: Bethany B/CMA,Medina-Vargas, Kynzi Levay C, NP  and patient  Time spent on call:  11 minutes

## 2023-01-28 NOTE — Progress Notes (Signed)
I connected with  Shawn Kemp on 01/28/23 by a video enabled telemedicine application and verified that I am speaking with the correct person using two identifiers.   I discussed the limitations of evaluation and management by telemedicine. The patient expressed understanding and agreed to proceed.      DATE:  01/28/2023 MRN:  [id number]  BIRTHDAY: 1990/08/18   Contact Information   None on File    Other Contacts     Name Relation Home Work Mobile   Dormer,Madison Spouse [phone]  [phone]   Samule Ohm   [phone]        Code Status History     Date Active Date Inactive Code Status Order ID Comments User Context   06/22/2017 1252 06/24/2017 1404 Full Code [id number]  [place], [place] [facility]        Chief Complaint  Patient presents with   Medical Management of Chronic Issues     Discuss diabetes regimen     HISTORY OF PRESENT ILLNESS: This is a 32 year old male who had a video visit today regarding Semeglee not being covere by his insurance. Latest CBG 132. He uses FreeStyle Libre to check his blood sugar. He has used Lantus before. He, also take Novolog 6 units TID for diabetes mellitus with meals. BP this morning was 132/80, takes Benazepril for hypertension.    PAST MEDICAL HISTORY:  Past Medical History:  Diagnosis Date   Diabetes mellitus without complication (HCC)      CURRENT MEDICATIONS: Reviewed  Patient's Medications  New Prescriptions   INSULIN GLARGINE (LANTUS SOLOSTAR) 100 UNIT/ML SOLOSTAR PEN    Inject 25 Units into the skin at bedtime.  Previous Medications   BENAZEPRIL (LOTENSIN) 20 MG TABLET    Take 1 tablet by mouth once daily   BUPROPION (WELLBUTRIN) 75 MG TABLET    Take 1 tablet (75 mg total) by mouth daily.   CONTINUOUS BLOOD GLUC RECEIVER (FREESTYLE LIBRE 3 READER) DEVI    1 each by Does not apply route as directed. DX: E10.9   CONTINUOUS GLUCOSE SENSOR (FREESTYLE LIBRE 3 SENSOR) MISC    APPLY 1 PATCH TOPICALLY EVERY  14 DAYS. USE TO CHECK GLUCOSE CONTINUOUSLY   NOVOLOG 100 UNIT/ML INJECTION    Inject 6 Units into the skin 3 (three) times daily before meals.   RELION INSULIN SYRINGE 1ML/31G 31G X 5/16" 1 ML MISC    See admin instructions.  Modified Medications   No medications on file  Discontinued Medications   SEMGLEE, YFGN, 100 UNIT/ML INJECTION    Inject 0.25 mLs (25 Units total) into the skin at bedtime.     No Known Allergies   REVIEW OF SYSTEMS:  GENERAL: no change in appetite, no fatigue, no weight changes, no fever, chills or weakness SKIN: Denies rash, itching, wounds, ulcer sores, or nail abnormality EYES: Denies change in vision, dry eyes, eye pain, itching or discharge EARS: Denies change in hearing, ringing in ears, or earache NOSE: Denies nasal congestion or epistaxis MOUTH and THROAT: Denies oral discomfort, gingival pain or bleeding, pain from teeth or hoarseness   RESPIRATORY: no cough, SOB, DOE, wheezing, hemoptysis CARDIAC: no chest pain, edema or palpitations GI: no abdominal pain, diarrhea, constipation, heart burn, nausea or vomiting GU: Denies dysuria, frequency, hematuria, incontinence, or discharge NEUROLOGICAL: Denies dizziness, syncope, numbness, or headache PSYCHIATRIC: Denies feeling of depression or anxiety. No report of hallucinations, insomnia, paranoia, or agitation   LABS/RADIOLOGY: Labs reviewed: Basic Metabolic Panel: Recent Labs    03/20/22  0000 10/08/22 0900  NA 136* 143  K 4.4 3.7  CL 103 104  CO2 27* 27  GLUCOSE  --  41*  BUN 22* 22  CREATININE 1.4* 1.33*  CALCIUM 9.8 9.5   Liver Function Tests: Recent Labs    03/20/22 0000 10/08/22 0900  AST 32 28  ALT 37 26  BILITOT  --  1.4*  PROT  --  6.6  ALBUMIN 4.7  --    No results for input(s): "LIPASE", "AMYLASE" in the last 8760 hours. No results for input(s): "AMMONIA" in the last 8760 hours. CBC: Recent Labs    10/08/22 0900  WBC 9.1  NEUTROABS 6,525  HGB 15.1  HCT 44.7  MCV  90.1  PLT 260   A1C: Invalid input(s): "A1C" Lipid Panel: Recent Labs    10/08/22 0900  HDL 61   Cardiac Enzymes: No results for input(s): "CKTOTAL", "CKMB", "CKMBINDEX", "TROPONINI" in the last 8760 hours. BNP: Invalid input(s): "POCBNP" CBG: No results for input(s): "GLUCAP" in the last 8760 hours.    No results found.  ASSESSMENT/PLAN:  1. 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  Dispense: 15 mL; Refill: PRN  2. Essential hypertension -  BP at home 132/82 -  continue Benazepril  3. Anxiety -  "feels fine" -  continue Wellbutrin      Time spent on non face to face visit:  6  The patient gave consent to this video visit. Explained to the patient the risk and privacy issue that was involved with this video call.   The patient was advised to call back and ask for an in-person evaluation if the symptoms worsen or if the condition fails to improve.   Kenard Gower, NP [employer] [phone]

## 2023-02-23 ENCOUNTER — Encounter: Payer: Self-pay | Admitting: Urology

## 2023-02-23 ENCOUNTER — Ambulatory Visit (INDEPENDENT_AMBULATORY_CARE_PROVIDER_SITE_OTHER): Payer: Medicaid Other | Admitting: Urology

## 2023-02-23 VITALS — BP 146/78 | HR 76

## 2023-02-23 DIAGNOSIS — Z302 Encounter for sterilization: Secondary | ICD-10-CM

## 2023-02-23 DIAGNOSIS — Z3009 Encounter for other general counseling and advice on contraception: Secondary | ICD-10-CM

## 2023-02-23 NOTE — Progress Notes (Signed)
The patient has reviewed information regarding the risks and complications and understands the advantages and disadvantages of elective sterilization. With full informed written and oral consent, he has elected to proceed with vasectomy. The patient was placed in the supine position, and the genitalia was sterilely prepped with betadine and draped; 1% lidocaine was used for local anesthesia of the skin and perivasal tissue in the midline. The right vas was then grasped through the skin using the non-piercing vas clamp, the skin was pierced with a single blade of the sharpened hemostat. The sharpened hemostat was then placed through this incision, and the perivasal tissue cleared from around the vas itself. The vas was then delivered through the wound and clamped with a vas clamp, a segment of approximately 1 cm of vasal tissue was cleared, and a 2-0 chromic suture was then passed beneath the vas. The chromic suture was then tied proximally, and a second one was tied distally, isolating the approximately 1 cm segment of vas. I then excised the segment of vas and cauterized the proximal and distal lumens with the eye cautery. The excess suture was then cut, and the ligated ends were covered with dartos fascia and buried, and the vas was allowed to drop back into the scrotum after observation for any bleeding. The contralateral vas was then treated in an identical fashion. Any further bleeding points were cauterized. The wound was then observed for any bleeding, the skin edges compressed, and none was noted. The patient tolerated the procedure well and without complications.   A sterile gauze dressing was applied as well as a form of scrotal supporting clothing. He was sent home with instructions regarding wound care and signs of infection, and recommended to restrict activity for 48 hours. .  He was instructed to use ice for the next 12 hours.  He was also instructed to call immediately for excessive bleeding,  swelling, drainage, discomfort, fever or chills. He was informed he should not consider himself sterile until a  semen analysis at 12 weeks has  been noted to be completely free of sperm.  

## 2023-02-23 NOTE — Patient Instructions (Signed)
Vasectomy Postoperative Instructions ? ?Please bring back a semen analysis in approximately 3 months.  ?Your semen analysis  will need to be taken to  ?[facility]  [address], [place], [place] [address] ?[phone] ? ? You will be given a sterile specimen cup. Please label the cup with your name, date of birth, date and time of collections.  ?What to Expect ? - slight redness, swelling and scant drainage along the incision ? - mild to moderate discomfort ? - black and blue (bruising) as the tissue heals ? - low grade fever ? - scrotal sensitivity and/or tenderness ?- Edges of the incision may pull apart and heal slowly, sometimes a knot may be present which remains for several months.  This is NORMAL and all part of the healing process. ?- if stitches are placed, they do not need to be removed ?- if you have pain or discomfort immediately after the vasectomy, you may use OTC pain medication for relief , ex: tylenol.  After local anesthetic wears off an ice pack will provide additional comfort and can also prevent swelling if used ? ?Activity ? - no sexual intercourse for at lease 5 days depending on comfort ? - no heavy lifting for 48-72 hours (anything over 5-10 lbs) ? ?Wound Care ? - shower only after 24 hours ? - no tub baths, hot tub, or pools for at least 7 days ? - ice packs for 48 hours: 30 minutes on and 30 minutes off ? ?Problem to Report ? - generalized redness ? - increased pain and swelling ? - fever greater than 101 F ? - significant drainage or bleeding from the wound ? ?TO DO ?- Ejaculations help to clear the passage of sperm, but you must use another from of birth control until you are told you may discontinue its use!! ?- You will be given a specimen cup to bring back a semen sample in 3 months to check and see if its clear of sperm.  Only after the semen is sent for analysis and is reported back as clear should you use this as your primary form of birth control!    ?

## 2023-03-03 ENCOUNTER — Telehealth: Payer: Self-pay

## 2023-03-03 NOTE — Telephone Encounter (Signed)
Patient states since his vasectomy he is still having pain and pressure.  He is concerned that after his procedure he fell and is wondering if he may have messed up the incision area.  He is still doing light lifting, he still has swelling, is using ice as needed.  Do you have any advice or recommendations for patient.

## 2023-03-07 NOTE — Telephone Encounter (Signed)
Patient is doing better at this time. He is aware of MD recommendations.

## 2023-03-16 ENCOUNTER — Other Ambulatory Visit: Payer: Self-pay | Admitting: Adult Health

## 2023-03-27 ENCOUNTER — Other Ambulatory Visit: Payer: Self-pay | Admitting: Adult Health

## 2023-03-27 DIAGNOSIS — F419 Anxiety disorder, unspecified: Secondary | ICD-10-CM

## 2023-04-12 ENCOUNTER — Other Ambulatory Visit: Payer: Self-pay | Admitting: Adult Health

## 2023-05-04 ENCOUNTER — Other Ambulatory Visit: Payer: Self-pay | Admitting: Adult Health

## 2023-05-10 ENCOUNTER — Other Ambulatory Visit: Payer: Self-pay

## 2023-05-10 DIAGNOSIS — E109 Type 1 diabetes mellitus without complications: Secondary | ICD-10-CM

## 2023-05-10 MED ORDER — FREESTYLE LIBRE 3 PLUS SENSOR MISC
11 refills | Status: DC
Start: 2023-05-10 — End: 2023-06-02

## 2023-05-11 ENCOUNTER — Ambulatory Visit (INDEPENDENT_AMBULATORY_CARE_PROVIDER_SITE_OTHER): Payer: Medicaid Other | Admitting: Adult Health

## 2023-05-11 ENCOUNTER — Encounter: Payer: Self-pay | Admitting: Adult Health

## 2023-05-11 VITALS — BP 118/78 | HR 88 | Temp 97.5°F | Ht 67.0 in | Wt 204.4 lb

## 2023-05-11 DIAGNOSIS — I10 Essential (primary) hypertension: Secondary | ICD-10-CM

## 2023-05-11 DIAGNOSIS — E109 Type 1 diabetes mellitus without complications: Secondary | ICD-10-CM

## 2023-05-11 DIAGNOSIS — F419 Anxiety disorder, unspecified: Secondary | ICD-10-CM | POA: Diagnosis not present

## 2023-05-11 MED ORDER — LANTUS SOLOSTAR 100 UNIT/ML ~~LOC~~ SOPN
25.0000 [IU] | PEN_INJECTOR | Freq: Every day | SUBCUTANEOUS | 99 refills | Status: DC
Start: 2023-05-11 — End: 2023-05-29

## 2023-05-11 MED ORDER — BENAZEPRIL HCL 20 MG PO TABS: 20.0000 mg | ORAL_TABLET | Freq: Every day | ORAL | 1 refills | Status: DC

## 2023-05-11 MED ORDER — NOVOLOG 100 UNIT/ML IJ SOLN: 6.0000 [IU] | Freq: Three times a day (TID) | INTRAMUSCULAR | 3 refills | Status: DC

## 2023-05-11 MED ORDER — BUPROPION HCL 75 MG PO TABS
75.0000 mg | ORAL_TABLET | Freq: Every day | ORAL | 1 refills | Status: DC
Start: 2023-05-11 — End: 2023-11-17

## 2023-05-11 NOTE — Progress Notes (Signed)
[facility] clinic  Provider:  Tarri Guilfoil Medina-Vargas DNp  Code Status:  Full Code  Goals of Care:     01/28/2023   11:30 AM  Advanced Directives  Does Patient Have a Medical Advance Directive? No  Would patient like information on creating a medical advance directive? No - Patient declined     Chief Complaint  Patient presents with   Medical Management of Chronic Issues    6 month follow. Pt is not fasting.    Diabetes    Pt want advice on the OminPod Device/pump for his diabetes.     HPI: Patient is a 32 y.o. male seen today for medical management of chronic issues.  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 Feels fine." Takes Well burtrin    Past Medical History:  Diagnosis Date   Diabetes mellitus without complication (HCC)     History reviewed. No pertinent surgical history.  No Known Allergies  Outpatient Encounter Medications as of 05/11/2023  Medication Sig   benazepril (LOTENSIN) 20 MG tablet Take 1 tablet by mouth once daily   buPROPion (WELLBUTRIN) 75 MG tablet Take 1 tablet by mouth once daily   Continuous Blood Gluc Receiver (FREESTYLE LIBRE 3 READER) DEVI 1 each by Does not apply route as directed. DX: E10.9   Continuous Glucose Sensor (FREESTYLE LIBRE 3 PLUS SENSOR) MISC Use to check blood sugar continuously. Change sensor every 15 days.   insulin glargine (LANTUS SOLOSTAR) 100 UNIT/ML Solostar Pen Inject 25 Units into the skin at bedtime.   NOVOLOG 100 UNIT/ML injection Inject 6 Units into the skin 3 (three) times daily before meals.   RELION INSULIN SYRINGE 1ML/31G 31G X 5/16" 1 ML MISC See admin instructions.   No facility-administered encounter medications on file as of 05/11/2023.    Review of Systems:  Review of Systems  Constitutional:  Negative for activity change, appetite change and fever.  HENT:  Negative  for sore throat.   Eyes: Negative.   Cardiovascular:  Negative for chest pain and leg swelling.  Gastrointestinal:  Negative for abdominal distention, diarrhea and vomiting.  Genitourinary:  Negative for dysuria, frequency and urgency.  Skin:  Negative for color change.  Neurological:  Negative for dizziness and headaches.  Psychiatric/Behavioral:  Negative for behavioral problems and sleep disturbance. The patient is not nervous/anxious.     Health Maintenance  Topic Date Due   COVID-19 Vaccine (1 - 2023-24 season) Never done   INFLUENZA VACCINE  10/17/2023 (Originally 02/17/2023)   Diabetic kidney evaluation - eGFR measurement  10/08/2023   Diabetic kidney evaluation - Urine ACR  10/08/2023   DTaP/Tdap/Td (3 - Td or Tdap) 06/23/2027   Hepatitis C Screening  Completed   HIV Screening  Completed   HPV VACCINES  Aged Out    Physical Exam: Vitals:   05/11/23 1102  BP: 118/78  Pulse: 88  Temp: (!) 97.5 F (36.4 C)  SpO2: 97%  Weight: 204 lb 6.4 oz (92.7 kg)  Height: 5\' 7"  (1.702 m)   Body mass index is 32.01 kg/m. Physical Exam Constitutional:      General: He is not in acute distress.    Appearance: He is obese.  HENT:     Head: Normocephalic and atraumatic.     Mouth/Throat:     Mouth: Mucous membranes are moist.  Eyes:     Conjunctiva/sclera:  Conjunctivae normal.  Cardiovascular:     Rate and Rhythm: Normal rate and regular rhythm.     Pulses: Normal pulses.     Heart sounds: Normal heart sounds.  Pulmonary:     Effort: Pulmonary effort is normal.     Breath sounds: Normal breath sounds.  Abdominal:     General: Bowel sounds are normal.     Palpations: Abdomen is soft.  Musculoskeletal:        General: No swelling. Normal range of motion.     Cervical back: Normal range of motion.  Skin:    General: Skin is warm and dry.  Neurological:     General: No focal deficit present.     Mental Status: He is alert and oriented to person, place, and time.   Psychiatric:        Mood and Affect: Mood normal.        Behavior: Behavior normal.        Thought Content: Thought content normal.        Judgment: Judgment normal.     Labs reviewed: Basic Metabolic Panel: Recent Labs    10/08/22 0900  NA 143  K 3.7  CL 104  CO2 27  GLUCOSE 41*  BUN 22  CREATININE 1.33*  CALCIUM 9.5   Liver Function Tests: Recent Labs    10/08/22 0900  AST 28  ALT 26  BILITOT 1.4*  PROT 6.6   No results for input(s): "LIPASE", "AMYLASE" in the last 8760 hours. No results for input(s): "AMMONIA" in the last 8760 hours. CBC: Recent Labs    10/08/22 0900  WBC 9.1  NEUTROABS 6,525  HGB 15.1  HCT 44.7  MCV 90.1  PLT 260   Lipid Panel: Recent Labs    10/08/22 0900  CHOL 147  HDL 61  LDLCALC 75  TRIG 40  CHOLHDL 2.4   Lab Results  Component Value Date   HGBA1C 7.3 (H) 10/08/2022    Procedures since last visit: No results found.  Assessment/Plan  1. 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  Dispense: 10 mL; Refill: 3 - insulin glargine (LANTUS SOLOSTAR) 100 UNIT/ML Solostar Pen; Inject 25 Units into the skin at bedtime.  Dispense: 15 mL; Refill: PRN - Hemoglobin A1c; Future  2. Essential hypertension -  BP 118/78, stable -  continue Lotensin - benazepril (LOTENSIN) 20 MG tablet; Take 1 tablet (20 mg total) by mouth daily.  Dispense: 90 tablet; Refill: 1 - CBC with Differential/Platelet; Future - Comprehensive metabolic panel; Future  3. Anxiety -  mood is stable -  continue Bupropion - buPROPion (WELLBUTRIN) 75 MG tablet; Take 1 tablet (75 mg total) by mouth daily.  Dispense: 90 tablet; Refill: 1    Labs/tests ordered:  CBC, CMP and A1C   Next appt:  Visit date not found

## 2023-05-13 ENCOUNTER — Encounter (INDEPENDENT_AMBULATORY_CARE_PROVIDER_SITE_OTHER): Payer: Medicaid Other | Admitting: Adult Health

## 2023-05-15 NOTE — Progress Notes (Signed)
This encounter was created in error - please disregard.

## 2023-05-18 ENCOUNTER — Other Ambulatory Visit: Payer: Medicaid Other

## 2023-05-18 DIAGNOSIS — I10 Essential (primary) hypertension: Secondary | ICD-10-CM

## 2023-05-18 DIAGNOSIS — E109 Type 1 diabetes mellitus without complications: Secondary | ICD-10-CM

## 2023-05-19 LAB — COMPREHENSIVE METABOLIC PANEL
AG Ratio: 2.1 (calc) (ref 1.0–2.5)
ALT: 21 U/L (ref 9–46)
AST: 29 U/L (ref 10–40)
Albumin: 4.9 g/dL (ref 3.6–5.1)
Alkaline phosphatase (APISO): 89 U/L (ref 36–130)
BUN/Creatinine Ratio: 24 (calc) — ABNORMAL HIGH (ref 6–22)
BUN: 28 mg/dL — ABNORMAL HIGH (ref 7–25)
CO2: 28 mmol/L (ref 20–32)
Calcium: 10 mg/dL (ref 8.6–10.3)
Chloride: 102 mmol/L (ref 98–110)
Creat: 1.17 mg/dL (ref 0.60–1.26)
Globulin: 2.3 g/dL (ref 1.9–3.7)
Glucose, Bld: 47 mg/dL — ABNORMAL LOW (ref 65–99)
Potassium: 3.9 mmol/L (ref 3.5–5.3)
Sodium: 139 mmol/L (ref 135–146)
Total Bilirubin: 0.9 mg/dL (ref 0.2–1.2)
Total Protein: 7.2 g/dL (ref 6.1–8.1)

## 2023-05-19 LAB — CBC WITH DIFFERENTIAL/PLATELET
Absolute Lymphocytes: 1989 {cells}/uL (ref 850–3900)
Absolute Monocytes: 468 {cells}/uL (ref 200–950)
Basophils Absolute: 39 {cells}/uL (ref 0–200)
Basophils Relative: 0.6 %
Eosinophils Absolute: 208 {cells}/uL (ref 15–500)
Eosinophils Relative: 3.2 %
HCT: 48.8 % (ref 38.5–50.0)
Hemoglobin: 16.3 g/dL (ref 13.2–17.1)
MCH: 29.8 pg (ref 27.0–33.0)
MCHC: 33.4 g/dL (ref 32.0–36.0)
MCV: 89.2 fL (ref 80.0–100.0)
MPV: 9.8 fL (ref 7.5–12.5)
Monocytes Relative: 7.2 %
Neutro Abs: 3796 {cells}/uL (ref 1500–7800)
Neutrophils Relative %: 58.4 %
Platelets: 238 10*3/uL (ref 140–400)
RBC: 5.47 10*6/uL (ref 4.20–5.80)
RDW: 12.3 % (ref 11.0–15.0)
Total Lymphocyte: 30.6 %
WBC: 6.5 10*3/uL (ref 3.8–10.8)

## 2023-05-19 LAB — HEMOGLOBIN A1C
Hgb A1c MFr Bld: 6.9 %{Hb} — ABNORMAL HIGH (ref <5.7)
Mean Plasma Glucose: 151 mg/dL
eAG (mmol/L): 8.4 mmol/L

## 2023-05-29 ENCOUNTER — Other Ambulatory Visit: Payer: Self-pay | Admitting: Adult Health

## 2023-05-29 DIAGNOSIS — E109 Type 1 diabetes mellitus without complications: Secondary | ICD-10-CM

## 2023-05-29 MED ORDER — LANTUS SOLOSTAR 100 UNIT/ML ~~LOC~~ SOPN
20.0000 [IU] | PEN_INJECTOR | Freq: Every day | SUBCUTANEOUS | Status: DC
Start: 2023-05-29 — End: 2023-10-10

## 2023-05-29 NOTE — Progress Notes (Signed)
-    glucose 47, too low -  A1C 6.9, down from 7.3, please lower Lantus from 25 units Q HS to 20 units Q HS, continue Novolog 6 units TID with meals -   CBC, normal, no anemia -

## 2023-05-31 ENCOUNTER — Telehealth: Payer: Self-pay

## 2023-05-31 NOTE — Telephone Encounter (Signed)
Unable to reach patient by phone.  Left a vm requesting call back for results as well as informing pt that I will send MD response via mychart.

## 2023-05-31 NOTE — Telephone Encounter (Signed)
-----   Message from Bertram Millard Dahlstedt sent at 05/31/2023 10:45 AM EST ----- Call patient-I looked at his semen analysis.  No sperm are present.  He can stop birth control

## 2023-06-01 ENCOUNTER — Other Ambulatory Visit: Payer: Self-pay | Admitting: Adult Health

## 2023-06-01 DIAGNOSIS — E109 Type 1 diabetes mellitus without complications: Secondary | ICD-10-CM

## 2023-06-27 ENCOUNTER — Encounter: Payer: Self-pay | Admitting: Dermatology

## 2023-06-27 ENCOUNTER — Ambulatory Visit (INDEPENDENT_AMBULATORY_CARE_PROVIDER_SITE_OTHER): Payer: Self-pay | Admitting: Dermatology

## 2023-06-27 VITALS — BP 119/78 | HR 74

## 2023-06-27 DIAGNOSIS — D485 Neoplasm of uncertain behavior of skin: Secondary | ICD-10-CM

## 2023-06-27 DIAGNOSIS — D2239 Melanocytic nevi of other parts of face: Secondary | ICD-10-CM

## 2023-06-27 DIAGNOSIS — D492 Neoplasm of unspecified behavior of bone, soft tissue, and skin: Secondary | ICD-10-CM

## 2023-06-27 NOTE — Patient Instructions (Signed)

## 2023-06-27 NOTE — Progress Notes (Signed)
   New Patient Visit   Subjective  Shawn Kemp is a 32 y.o. male who presents for the following: Spot Check  Patient states he  has spot check located at the Left Nostril that he  would like to have examined. Patient reports the areas have been there for 6 years. He reports the areas are not bothersome. Patient rates irritation 1 out of 10. He states that the areas have not spread. Patient reports he  has not previously been treated for these areas. Patient denies Hx of bx. Patient denies family history of skin cancer(s).  The patient has spots, moles and lesions to be evaluated, some may be new or changing and the patient may have concern these could be cancer.  The following portions of the chart were reviewed this encounter and updated as appropriate: medications, allergies, medical history  Review of Systems:  No other skin or systemic complaints except as noted in HPI or Assessment and Plan.  Objective  Well appearing patient in no apparent distress; mood and affect are within normal limits.  A focused examination was performed of the following areas: Left Nostril  Relevant exam findings are noted in the Assessment and Plan.  Left Ala Nasi 4.5 mm red papule with telangectasia        Assessment & Plan   1. Suspicious lesion on nose - Assessment: Patient presents with a 4.5 mm red papule with telangiectasias on the nose, which has bled twice. 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 - Plan:   - Perform shave biopsy of the lesion.   - Send specimen to lab for histopathological examination.   - Await biopsy results (expected in 1-2 weeks).   - If benign (fibrous papule):     - Allow area to heal.     - Monitor for regrowth.     - Consider hyfrecator treatment if lesion recurs.   - If basal cell carcinoma:     - Refer to  Dr. Caralyn Guile for MOHS surgical excision.   - Communicate results to patient via MyChart if benign phone call if skin cancer.    Neoplasm of uncertain behavior of skin Left Ala Nasi  Skin / nail biopsy Type of biopsy: tangential   Informed consent: discussed and consent obtained   Timeout: patient name, date of birth, surgical site, and procedure verified   Procedure prep:  Patient was prepped and draped in usual sterile fashion Prep type:  Isopropyl alcohol Anesthesia: the lesion was anesthetized in a standard fashion   Anesthetic:  1% lidocaine w/ epinephrine 1-100,000 buffered w/ 8.4% NaHCO3 Instrument used: DermaBlade   Hemostasis achieved with: aluminum chloride   Outcome: patient tolerated procedure well   Post-procedure details: sterile dressing applied and wound care instructions given   Dressing type: petrolatum gauze and bandage    Specimen 1 - Surgical pathology Differential Diagnosis: BCC vs Fibrous Papule  Check Margins: No    No follow-ups on file.    Documentation: I have reviewed the above documentation for accuracy and completeness, and I agree with the above.  Stasia Cavalier, am acting as scribe for Langston Reusing, DO.  Langston Reusing, DO

## 2023-06-30 LAB — SURGICAL PATHOLOGY

## 2023-07-26 ENCOUNTER — Other Ambulatory Visit: Payer: Self-pay | Admitting: Adult Health

## 2023-07-26 ENCOUNTER — Telehealth: Payer: Self-pay

## 2023-07-26 DIAGNOSIS — E109 Type 1 diabetes mellitus without complications: Secondary | ICD-10-CM

## 2023-07-26 NOTE — Telephone Encounter (Signed)
 Spoke with Aisa, verified NPI  for the 3rd time, who then transferred me to the pharmacy line. Ref ID # [id number]. I spoke with someone in the pharmacy department, verified NPI again for the prescriber, who explained that for a initial PA it will only be approved for 6 months. After 6 months we can call to get it approved x 12 months.   PA is pending determination, takes 24 hours for approval. We can call back or check the protal [email or web address] and use the information below:  PA # [id number], Other Ref # [id number]

## 2023-07-26 NOTE — Telephone Encounter (Signed)
 I initiated prior authorization through covermymeds and message populated to call [employer] at [phone]

## 2023-08-17 ENCOUNTER — Encounter: Payer: Self-pay | Admitting: Adult Health

## 2023-08-17 ENCOUNTER — Telehealth: Payer: Self-pay | Admitting: Adult Health

## 2023-08-17 DIAGNOSIS — F419 Anxiety disorder, unspecified: Secondary | ICD-10-CM

## 2023-08-17 DIAGNOSIS — E109 Type 1 diabetes mellitus without complications: Secondary | ICD-10-CM

## 2023-08-17 DIAGNOSIS — J020 Streptococcal pharyngitis: Secondary | ICD-10-CM

## 2023-08-17 MED ORDER — CEFADROXIL 500 MG PO CAPS: 500.0000 mg | ORAL_CAPSULE | Freq: Two times a day (BID) | ORAL | 0 refills | Status: AC

## 2023-08-17 NOTE — Progress Notes (Signed)
I connected with  Shawn Kemp on 08/17/23 by a video enabled telemedicine application and verified that I am speaking with the correct person using two identifiers.   I discussed the limitations of evaluation and management by telemedicine. The patient expressed understanding and agreed to proceed.

## 2023-08-17 NOTE — Progress Notes (Signed)
DATE:  08/17/2023 MRN:  [id number]  BIRTHDAY: 07-12-1991   Contact Information     Name Relation Home Work Mobile   Kemp,Shawn Spouse [phone]  [phone]      Other Contacts     Name Relation Home Work Mobile   Vic Ripper Friend   [phone]        Code Status History     Date Active Date Inactive Code Status Order ID Comments User Context   06/22/2017 1252 06/24/2017 1404 Full Code [id number]  [place] [place] [facility]        Chief Complaint  Patient presents with   Acute Visit    head congestion, and other cold flu like symptoms    Discussed the use of AI scribe software for clinical note transcription with the patient, who gave verbal consent to proceed.   HISTORY OF PRESENT ILLNESS:  A video visit was done today due to fever and congestion.  He has been experiencing fever and congestion for the past two days, with a temperature reaching up to 102F. Alongside these symptoms, he has fatigue and a sore throat. The symptoms began with a severe headache on Saturday, which then progressed to include fever, chills, and sore throat.  His family, including his three sons and wife, have been diagnosed with strep throat, and he suspects he may have contracted it as well. No significant illness is reported in his daughters, who may have mild symptoms.  He manages his symptoms with Tylenol, ibuprofen, and Alka-Seltzer, and gargles with warm salt water to alleviate his sore throat. His blood sugar levels have been stable, currently around 150 mg/dL. He continues to take Lantus 20 units at bedtime and NovoLog 6 units three times a day before meals. He also takes Wellbutrin 75 mg daily for anxiety, which he states is well-managed.       PAST MEDICAL HISTORY:  Past Medical History:  Diagnosis Date   Diabetes mellitus without complication (HCC)      CURRENT MEDICATIONS: Reviewed  Patient's Medications  New Prescriptions   No medications on file  Previous  Medications   BENAZEPRIL (LOTENSIN) 20 MG TABLET    Take 1 tablet (20 mg total) by mouth daily.   BUPROPION (WELLBUTRIN) 75 MG TABLET    Take 1 tablet (75 mg total) by mouth daily.   CONTINUOUS BLOOD GLUC RECEIVER (FREESTYLE LIBRE 3 READER) DEVI    1 each by Does not apply route as directed. DX: E10.9   CONTINUOUS GLUCOSE SENSOR (FREESTYLE LIBRE 3 SENSOR) MISC    APPLY 1 PATCH TOPICALLY EVERY 14 DAYS TO  CHECK  GLUCOSE  CONTINUOUSLY   INSULIN GLARGINE (LANTUS SOLOSTAR) 100 UNIT/ML SOLOSTAR PEN    Inject 20 Units into the skin at bedtime.   NOVOLOG 100 UNIT/ML INJECTION    Inject 6 Units into the skin 3 (three) times daily before meals.   RELION INSULIN SYRINGE 1ML/31G 31G X 5/16" 1 ML MISC    See admin instructions.  Modified Medications   No medications on file  Discontinued Medications   No medications on file     No Known Allergies   REVIEW OF SYSTEMS:  GENERAL: has fever, chills and fatigue SKIN: Denies rash, itching, wounds, ulcer sores, or nail abnormality EYES: Denies change in vision, dry eyes, eye pain, itching or discharge EARS: Denies change in hearing, ringing in ears, or earache NOSE: Denies nasal congestion or epistaxis MOUTH and THROAT: has sore throat RESPIRATORY: no cough, SOB, DOE, wheezing, hemoptysis  CARDIAC: no chest pain, edema or palpitations GI: no abdominal pain, diarrhea, constipation, heart burn, nausea or vomiting GU: Denies dysuria, frequency, hematuria, incontinence, or discharge MUSCULOSKELETAL: Denies joit pain, muscle pain, back pain, restricted movement, or unusual weakness NEUROLOGICAL: has headache PSYCHIATRIC: Denies feeling of depression or anxiety. No report of hallucinations, insomnia, paranoia, or agitation   LABS/RADIOLOGY: Labs reviewed: Basic Metabolic Panel: Recent Labs    10/08/22 0900 05/18/23 0848  NA 143 139  K 3.7 3.9  CL 104 102  CO2 27 28  GLUCOSE 41* 47*  BUN 22 28*  CREATININE 1.33* 1.17  CALCIUM 9.5 10.0   Liver  Function Tests: Recent Labs    10/08/22 0900 05/18/23 0848  AST 28 29  ALT 26 21  BILITOT 1.4* 0.9  PROT 6.6 7.2   No results for input(s): "LIPASE", "AMYLASE" in the last 8760 hours. No results for input(s): "AMMONIA" in the last 8760 hours. CBC: Recent Labs    10/08/22 0900 05/18/23 0848  WBC 9.1 6.5  NEUTROABS 6,525 3,796  HGB 15.1 16.3  HCT 44.7 48.8  MCV 90.1 89.2  PLT 260 238   A1C: Invalid input(s): "A1C" Lipid Panel: Recent Labs    10/08/22 0900  HDL 61   Cardiac Enzymes: No results for input(s): "CKTOTAL", "CKMB", "CKMBINDEX", "TROPONINI" in the last 8760 hours. BNP: Invalid input(s): "POCBNP" CBG: No results for input(s): "GLUCAP" in the last 8760 hours.    No results found.  ASSESSMENT/PLAN:  1. Strep throat (Primary) Fever, sore throat, and fatigue in the setting of confirmed strep throat in household contacts. -Start Cefadroxil, twice a day for 10 days. -Gargle with warm water and salt. -Continue Tylenol as needed for body aches and fever. - cefadroxil (DURICEF) 500 MG capsule; Take 1 capsule (500 mg total) by mouth 2 (two) times daily for 10 days.  Dispense: 20 capsule; Refill: 0  2. 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 Patient is adherent to current regimen of Lantus 20 units at bedtime and NovoLog 6 units three times a day before meals. -Continue current regimen.  3. Anxiety Patient reports feeling well on current regimen of Wellbutrin 75mg  daily. -Continue Wellbutrin 75mg  daily.    General Health Maintenance -Drink plenty of water. -Check-in after antibiotic course completion.         Time spent on non face to face visit:  10 minutes  The patient gave consent to this video visit. Explained to the patient the risk and privacy issue that was involved with this video call.   The patient was advised to call back and ask  for an in-person evaluation if the symptoms worsen or if the condition fails to improve.   Kenard Gower, NP [employer] [phone]

## 2023-08-17 NOTE — Progress Notes (Signed)
This service is provided via telemedicine  No vital signs collected/recorded due to the encounter was a telemedicine visit.   Location of patient (ex: home, work):  Home   Patient consents to a telephone visit:  Yes, 08/17/23    Location of the provider (ex: office, home):  [facility]  Name of any referring provider:  Medina-Vargas, Margit Banda, NP   Names of all persons participating in the telemedicine service and their role in the encounter: Michaelann Gunnoe B/CMA, Medina-Vargas, Monina C, NP , and patient  Time spent on call:  11 minutes

## 2023-10-10 ENCOUNTER — Telehealth: Payer: Self-pay | Admitting: Adult Health

## 2023-10-10 ENCOUNTER — Other Ambulatory Visit: Payer: Self-pay | Admitting: Adult Health

## 2023-10-10 DIAGNOSIS — E109 Type 1 diabetes mellitus without complications: Secondary | ICD-10-CM

## 2023-10-10 MED ORDER — RELION INSULIN SYRINGE 31G X 5/16" 1 ML MISC: 3 refills | Status: AC

## 2023-10-10 MED ORDER — INSULIN GLARGINE 100 UNIT/ML ~~LOC~~ SOLN: 20.0000 [IU] | Freq: Every day | SUBCUTANEOUS | 11 refills | Status: AC

## 2023-10-10 NOTE — Telephone Encounter (Signed)
 Medina-Vargas, Monina C, NP  You4 minutes ago (4:24 PM)    Pls send insulin syringes and needle supply for 3 months.

## 2023-10-10 NOTE — Telephone Encounter (Signed)
 Thank you for your help.

## 2023-10-10 NOTE — Telephone Encounter (Signed)
 Copied from CRM [id number]. Topic: Clinical - Medication Question >> Oct 10, 2023  2:38 PM Alvino Blood C wrote: Reason for CRM: Patient is asking that instead of having the following medication:insulin glargine (LANTUS SOLOSTAR) 100 UNIT/ML Solostar Pen as insulin needled he would rather a vial form because its cheaper

## 2023-10-10 NOTE — Telephone Encounter (Signed)
 Noted. Thanks.

## 2023-10-10 NOTE — Telephone Encounter (Signed)
 Insulin syringes sent to pharmacy as requested.

## 2023-10-10 NOTE — Telephone Encounter (Signed)
 Patient is requesting vial form of medication. I do not know what needles/syringes go with this.

## 2023-10-10 NOTE — Addendum Note (Signed)
 Addended by: Nelda Severe A on: 10/10/2023 04:46 PM   Modules accepted: Orders

## 2023-10-10 NOTE — Telephone Encounter (Signed)
Message routed to PCP Medina-Vargas, Monina C, NP  

## 2023-10-18 HISTORY — PX: WISDOM TOOTH EXTRACTION: SHX21

## 2023-11-10 ENCOUNTER — Ambulatory Visit: Payer: Medicaid Other | Admitting: Adult Health

## 2023-11-10 DIAGNOSIS — F419 Anxiety disorder, unspecified: Secondary | ICD-10-CM

## 2023-11-10 DIAGNOSIS — I10 Essential (primary) hypertension: Secondary | ICD-10-CM

## 2023-11-10 DIAGNOSIS — E109 Type 1 diabetes mellitus without complications: Secondary | ICD-10-CM

## 2023-11-17 ENCOUNTER — Encounter: Payer: Self-pay | Admitting: Adult Health

## 2023-11-17 ENCOUNTER — Ambulatory Visit (INDEPENDENT_AMBULATORY_CARE_PROVIDER_SITE_OTHER): Payer: Self-pay | Admitting: Adult Health

## 2023-11-17 VITALS — BP 112/70 | HR 77 | Temp 98.2°F | Ht 67.0 in | Wt 204.6 lb

## 2023-11-17 DIAGNOSIS — I10 Essential (primary) hypertension: Secondary | ICD-10-CM

## 2023-11-17 DIAGNOSIS — E109 Type 1 diabetes mellitus without complications: Secondary | ICD-10-CM

## 2023-11-17 DIAGNOSIS — F419 Anxiety disorder, unspecified: Secondary | ICD-10-CM

## 2023-11-17 MED ORDER — NOVOLOG 100 UNIT/ML IJ SOLN: 6.0000 [IU] | Freq: Three times a day (TID) | INTRAMUSCULAR | Status: DC

## 2023-11-17 MED ORDER — BENAZEPRIL HCL 10 MG PO TABS: 10.0000 mg | ORAL_TABLET | Freq: Every day | ORAL | 1 refills | Status: DC

## 2023-11-17 MED ORDER — BUPROPION HCL ER (SR) 100 MG PO TB12: 100.0000 mg | ORAL_TABLET | Freq: Every day | ORAL | 1 refills | Status: DC

## 2023-11-17 NOTE — Progress Notes (Unsigned)
 [facility] clinic  Provider:  Inge Mangle DNP  Code Status:  Full Code  Goals of Care:     08/17/2023    9:03 AM  Advanced Directives  Does Patient Have a Medical Advance Directive? No  Would patient like information on creating a medical advance directive? No - Patient declined     Chief Complaint  Patient presents with   Follow-up    6 month follow up diabetes , pt has hasn't any diabetes , hasn't missed any dose of medication , stated he has been under a lot of stress and anxiety    Discussed the use of AI scribe software for clinical note transcription with the patient, who gave verbal consent to proceed.  HPI: Patient is a 33 y.o. male seen today for an a 22-month follow up of chronic medical issues.  He experiences fluctuating blood sugar levels, describing it as a 'rollercoaster ride.' His Continuous Glucose Monitor (CGM) shows blood sugar ranging from 95 to 400. He attributes these fluctuations to a lack of regimen and has not seen an endocrinologist since his diagnosis at 31. His insulin  regimen includes NovoLog  on a sliding scale, varying between 4 and 10 units depending on meals, and Lantus  at 20 units daily. His average glucose over the past seven days is 161, with a recent A1c of 6.9%.  He experiences low blood pressure, feeling lightheaded, and needing to pause when standing up. During his wisdom teeth extraction and vasectomy, his blood pressure dropped significantly, reaching as low as 80/30. He is currently taking Lotensin  20 mg for blood pressure management.  He experiences high levels of anxiety, which he attributes to his stressful job and family issues. He is currently taking Wellbutrin  75 mg for anxiety. He describes himself as a 'very high strung person' and suspects he may have ADD, as he exhibits many symptoms associated with it.  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 He is a father of five  children, including two 55-month-old girls, and describes his job as stressful.    Past Medical History:  Diagnosis Date   Diabetes mellitus without complication [facility])     Past Surgical History:  Procedure Laterality Date   WISDOM TOOTH EXTRACTION Bilateral 10/2023    Allergies  Allergen Reactions   Lidocaine  Other (See Comments)    He passed out , causes his blood pressure to run very low.    Outpatient Encounter Medications as of 11/17/2023  Medication Sig   benazepril  (LOTENSIN ) 10 MG tablet Take 1 tablet (10 mg total) by mouth daily.   buPROPion  ER (WELLBUTRIN  SR) 100 MG 12 hr tablet Take 1 tablet (100 mg total) by mouth daily.   Continuous Blood Gluc Receiver (FREESTYLE LIBRE 3 READER) DEVI 1 each by Does not apply route as directed. DX: E10.9   Continuous Glucose Sensor (FREESTYLE LIBRE 3 SENSOR) MISC APPLY 1 PATCH TOPICALLY EVERY 14 DAYS TO  CHECK  GLUCOSE  CONTINUOUSLY   insulin  glargine (LANTUS ) 100 UNIT/ML injection Inject 0.2 mLs (20 Units total) into the skin daily.   RELION INSULIN  SYRINGE 1ML/31G 31G X 5/16" 1 ML MISC Use for insulin  once daily. Dx: E10.9   [DISCONTINUED] benazepril  (LOTENSIN ) 20 MG tablet Take 1 tablet (20 mg total) by mouth daily.   [DISCONTINUED] buPROPion  (WELLBUTRIN ) 75 MG tablet Take 1 tablet (75 mg total) by mouth daily.   [DISCONTINUED] NOVOLOG  100 UNIT/ML injection Inject 6 Units into the  skin 3 (three) times daily before meals.   NOVOLOG  100 UNIT/ML injection Inject 6-25 Units into the skin 3 (three) times daily before meals. 150-200 = 6 units, 200-250 = 10 units, 250 - 300 = 14 units, 300 - 350 = 18 units, 350-400 = 20 units, 400-450 = 22 units, 450- 500 = 25 units   No facility-administered encounter medications on file as of 11/17/2023.    Review of Systems:  Review of Systems  Constitutional:  Negative for activity change, appetite change and fever.  HENT:  Negative for sore throat.   Eyes: Negative.   Cardiovascular:  Negative for  chest pain and leg swelling.  Gastrointestinal:  Negative for abdominal distention, diarrhea and vomiting.  Genitourinary:  Negative for dysuria, frequency and urgency.  Skin:  Negative for color change.  Neurological:  Negative for dizziness and headaches.  Psychiatric/Behavioral:  Negative for behavioral problems and sleep disturbance. The patient is nervous/anxious.     Health Maintenance  Topic Date Due   COVID-19 Vaccine (1) Never done   Pneumococcal Vaccine 76-25 Years old (1 of 2 - PCV) Never done   INFLUENZA VACCINE  02/17/2024   Diabetic kidney evaluation - eGFR measurement  11/16/2024   Diabetic kidney evaluation - Urine ACR  11/16/2024   DTaP/Tdap/Td (3 - Td or Tdap) 06/23/2027   Hepatitis C Screening  Completed   HIV Screening  Completed   HPV VACCINES  Aged Out   Meningococcal B Vaccine  Aged Out    Physical Exam: Vitals:   11/17/23 1117  BP: 112/70  Pulse: 77  Temp: 98.2 F (36.8 C)  TempSrc: Temporal  SpO2: 97%  Weight: 204 lb 9.6 oz (92.8 kg)  Height: 5\' 7"  (1.702 m)   Body mass index is 32.04 kg/m. Physical Exam Constitutional:      Appearance: Normal appearance.  HENT:     Head: Normocephalic and atraumatic.     Mouth/Throat:     Mouth: Mucous membranes are moist.  Eyes:     Conjunctiva/sclera: Conjunctivae normal.  Cardiovascular:     Rate and Rhythm: Normal rate and regular rhythm.     Pulses: Normal pulses.     Heart sounds: Normal heart sounds.  Pulmonary:     Effort: Pulmonary effort is normal.     Breath sounds: Normal breath sounds.  Abdominal:     General: Bowel sounds are normal.     Palpations: Abdomen is soft.  Musculoskeletal:        General: No swelling. Normal range of motion.     Cervical back: Normal range of motion.  Skin:    General: Skin is warm and dry.  Neurological:     General: No focal deficit present.     Mental Status: He is alert and oriented to person, place, and time.  Psychiatric:        Mood and Affect:  Mood normal.        Behavior: Behavior normal.        Thought Content: Thought content normal.        Judgment: Judgment normal.     Labs reviewed: Basic Metabolic Panel: Recent Labs    05/18/23 0848 11/17/23 1233  NA 139 140  K 3.9 3.5  CL 102 104  CO2 28 30  GLUCOSE 47* 55*  BUN 28* 22  CREATININE 1.17 1.11  CALCIUM 10.0 9.6   Liver Function Tests: Recent Labs    05/18/23 0848  AST 29  ALT 21  BILITOT 0.9  PROT 7.2   No results for input(s): "LIPASE", "AMYLASE" in the last 8760 hours. No results for input(s): "AMMONIA" in the last 8760 hours. CBC: Recent Labs    05/18/23 0848 11/17/23 1233  WBC 6.5 6.5  NEUTROABS 3,796 3,874  HGB 16.3 15.0  HCT 48.8 45.1  MCV 89.2 89.7  PLT 238 220   Lipid Panel: Recent Labs    11/17/23 1233  CHOL 177  HDL 58  LDLCALC 108*  TRIG 36  CHOLHDL 3.1   Lab Results  Component Value Date   HGBA1C 7.3 (H) 11/17/2023    Procedures since last visit: No results found.  Assessment/Plan  1. 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 Average glucose 178 mg/dL, Z6X 0.9%. - Refer to endocrinologist for diabetes management and insulin  regimen education. - Order A1c test today. - Order BMP and urine microalbumin. - Continue current insulin  regimen - Ambulatory referral to Endocrinology - NOVOLOG  100 UNIT/ML injection; Inject 6-25 Units into the skin 3 (three) times daily before meals. 150-200 = 6 units, 200-250 = 10 units, 250 - 300 = 14 units, 300 - 350 = 18 units, 350-400 = 20 units, 400-450 = 22 units, 450- 500 = 25 units - Hemoglobin A1C - Microalbumin/Creatinine Ratio, Urine - Lipid panel  2. Essential hypertension -  Well-controlled but with episodes of lightheadedness and hypotension, suggesting orthostatic hypotension. Previous small cuff use may have caused inaccurate readings. - Reduce Lotensin  (benazepril ) to 10 mg daily. -  Monitor blood pressure at home, log and bring to next appointment - Educate on correct cuff size for accurate readings. - benazepril  (LOTENSIN ) 10 MG tablet; Take 1 tablet (10 mg total) by mouth daily.  Dispense: 90 tablet; Refill: 1 - CBC with Differential/Platelets - Basic Metabolic Panel with eGFR  3. Anxiety -  Elevated anxiety due to stress, possibly related to undiagnosed ADD. Current Wellbutrin  may be insufficient. - Increase Wellbutrin  (bupropion ) to 100 mg daily. - Refer to psychiatry for anxiety and potential ADD. - Discussed stress coping strategies.  - Ambulatory referral to Psychiatry     Labs/tests ordered: CBC, BMP, A1C, urine microalbumin creatinine ratio, lipid panel   Return in about 3 months (around 02/17/2024).  Birgit Nowling Medina-Vargas, NP

## 2023-11-18 LAB — CBC WITH DIFFERENTIAL/PLATELET
Absolute Lymphocytes: 1996 {cells}/uL (ref 850–3900)
Absolute Monocytes: 436 {cells}/uL (ref 200–950)
Basophils Absolute: 33 {cells}/uL (ref 0–200)
Basophils Relative: 0.5 %
Eosinophils Absolute: 163 {cells}/uL (ref 15–500)
Eosinophils Relative: 2.5 %
HCT: 45.1 % (ref 38.5–50.0)
Hemoglobin: 15 g/dL (ref 13.2–17.1)
MCH: 29.8 pg (ref 27.0–33.0)
MCHC: 33.3 g/dL (ref 32.0–36.0)
MCV: 89.7 fL (ref 80.0–100.0)
MPV: 9.7 fL (ref 7.5–12.5)
Monocytes Relative: 6.7 %
Neutro Abs: 3874 {cells}/uL (ref 1500–7800)
Neutrophils Relative %: 59.6 %
Platelets: 220 10*3/uL (ref 140–400)
RBC: 5.03 10*6/uL (ref 4.20–5.80)
RDW: 12.8 % (ref 11.0–15.0)
Total Lymphocyte: 30.7 %
WBC: 6.5 10*3/uL (ref 3.8–10.8)

## 2023-11-18 LAB — MICROALBUMIN / CREATININE URINE RATIO
Creatinine, Urine: 132 mg/dL (ref 20–320)
Microalb Creat Ratio: 2 mg/g{creat} (ref <30)
Microalb, Ur: 0.3 mg/dL

## 2023-11-18 LAB — LIPID PANEL
Cholesterol: 177 mg/dL (ref <200)
HDL: 58 mg/dL (ref >=40)
LDL Cholesterol (Calc): 108 mg/dL — ABNORMAL HIGH
Non-HDL Cholesterol (Calc): 119 mg/dL (ref <130)
Total CHOL/HDL Ratio: 3.1 (calc) (ref <5.0)
Triglycerides: 36 mg/dL (ref <150)

## 2023-11-18 LAB — HEMOGLOBIN A1C
Hgb A1c MFr Bld: 7.3 % — ABNORMAL HIGH (ref <5.7)
Mean Plasma Glucose: 163 mg/dL
eAG (mmol/L): 9 mmol/L

## 2023-11-18 LAB — BASIC METABOLIC PANEL WITHOUT GFR
BUN: 22 mg/dL (ref 7–25)
CO2: 30 mmol/L (ref 20–32)
Calcium: 9.6 mg/dL (ref 8.6–10.3)
Chloride: 104 mmol/L (ref 98–110)
Creat: 1.11 mg/dL (ref 0.60–1.26)
Glucose, Bld: 55 mg/dL — ABNORMAL LOW (ref 65–139)
Potassium: 3.5 mmol/L (ref 3.5–5.3)
Sodium: 140 mmol/L (ref 135–146)

## 2023-11-21 DIAGNOSIS — E109 Type 1 diabetes mellitus without complications: Secondary | ICD-10-CM | POA: Insufficient documentation

## 2023-11-21 DIAGNOSIS — F419 Anxiety disorder, unspecified: Secondary | ICD-10-CM | POA: Insufficient documentation

## 2023-11-21 DIAGNOSIS — I10 Essential (primary) hypertension: Secondary | ICD-10-CM | POA: Insufficient documentation

## 2023-11-21 NOTE — Progress Notes (Signed)
-       108, up from 75 (normal <100), will need to have low fat diet and continue to  exercise at least 150 minutes/week -   Cholesterol and triglycerides, urine microalbumin, hemoglobin, electrolytes normal - A1c 7.3, up from 6.9, was referred to endocrinology

## 2024-02-16 ENCOUNTER — Encounter (HOSPITAL_COMMUNITY): Payer: Self-pay | Admitting: Registered Nurse

## 2024-02-16 ENCOUNTER — Ambulatory Visit (HOSPITAL_COMMUNITY): Payer: PRIVATE HEALTH INSURANCE | Admitting: Registered Nurse

## 2024-02-16 DIAGNOSIS — G4700 Insomnia, unspecified: Secondary | ICD-10-CM

## 2024-02-16 DIAGNOSIS — F411 Generalized anxiety disorder: Secondary | ICD-10-CM | POA: Diagnosis not present

## 2024-02-16 DIAGNOSIS — F331 Major depressive disorder, recurrent, moderate: Secondary | ICD-10-CM

## 2024-02-16 MED ORDER — HYDROXYZINE HCL 10 MG PO TABS: ORAL_TABLET | ORAL | 0 refills | Status: DC

## 2024-02-16 MED ORDER — BUPROPION HCL ER (XL) 150 MG PO TB24: 150.0000 mg | ORAL_TABLET | Freq: Every day | ORAL | 1 refills | Status: DC
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
## 2024-02-16 NOTE — Patient Instructions (Addendum)
 If no one has contacted, you by the end of business day today please call the appropriate office listed below to schedule your next visit for medication management with Luisa Ruder, NP:    [facility] at [facility] [address], [place], [place] [address]  5.4 mi Phone: [phone] (Call to schedule appointment)  [facility] at [facility] [address], [place], [place] [address]  25 mi Phone: [phone] (Call to schedule appointment)  ADHD Online Testing         Address:  [phone]?[place], [place] [address] Phone: [phone] Fax: [phone] Email:  [email or web address]  [facility]HD Care in [place], [place] At [facility], we are dedicated to providing comprehensive ADHD treatment and management for patients of all ages. Our range of services is designed to address the unique needs of each individual, ensuring personalized and effective care.  ADHD Evaluation and Diagnosis Accurate diagnosis is the first step towards effective treatment. Our thorough evaluation process includes:   QbTest to objectively measure ADHD symptoms  Standardized ADHD rating scales  Behavioral assessments  Medical history review  Individualized Treatment Plans We believe in personalized care. Based on your evaluation, we develop a tailored treatment plan that may include:   Medication management  Lifestyle and dietary recommendations  Parent and family education  Social skills training  Organizational and time management strategies  Call 911, [phone], mobile crisis, or present to the nearest emergency room should you experience any suicidal/homicidal ideation, auditory/visual/hallucinations, or detrimental worsening of your mental health.  Mobile Crisis Response Teams Listed by counties in vicinity of [facility] providers [facility]  [phone] [facility] [phone] [facility] [phone] [facility] [phone] [place]                * [facility] [phone]                * [employer] [phone]  [facility] [phone] [facility] [employer]  [phone] * [employer] [phone]

## 2024-02-17 ENCOUNTER — Ambulatory Visit: Admitting: Adult Health

## 2024-02-17 ENCOUNTER — Encounter: Payer: Self-pay | Admitting: Adult Health

## 2024-02-17 VITALS — BP 132/68 | HR 77 | Temp 97.2°F | Resp 18 | Ht 67.0 in | Wt 212.4 lb

## 2024-02-17 DIAGNOSIS — E109 Type 1 diabetes mellitus without complications: Secondary | ICD-10-CM

## 2024-02-17 DIAGNOSIS — I10 Essential (primary) hypertension: Secondary | ICD-10-CM | POA: Diagnosis not present

## 2024-02-17 DIAGNOSIS — F419 Anxiety disorder, unspecified: Secondary | ICD-10-CM | POA: Diagnosis not present

## 2024-02-17 NOTE — Progress Notes (Signed)
 [facility] clinic  Provider:  Jereld Serum DNP  Code Status:  Full Code  Goals of Care:     08/17/2023    9:03 AM  Advanced Directives  Does Patient Have a Medical Advance Directive? No  Would patient like information on creating a medical advance directive? No - Patient declined     Chief Complaint  Patient presents with   Medical Management of Chronic Issues    3 month f/u    Discussed the use of AI scribe software for clinical note transcription with the patient, who gave verbal consent to proceed.  HPI: Patient is a 33 y.o. male seen today for a 58-month follow up of chronic medical issues.  He has experienced inconsistent blood sugar levels, ranging from the fifties to three hundreds, describing it as a 'roller coaster ride.' His last recorded A1c was 7.3% on May 1st. He is currently taking Lantus  20 units daily and uses a Novolog  sliding scale three times a day.  He is managing anxiety and depression with Wellbutrin , recently increased from 100 mg to 150 mg daily, and hydroxyzine  (Atarax ) 10 mg one to two tablets three times daily as needed for anxiety. He reports that hydroxyzine  makes him very tired and groggy the following day, especially after taking it at night.  He is on Lotensin  10 mg daily for hypertension, with blood pressure readings at home consistent with today's measurement of 132/68 mmHg.  He is a father of five, including twin girls who recently turned one year old. He works in [employer]/r[employer], often working 70-80 hours a week, which contributes to his anxiety. He does not smoke, drink alcohol, or use drugs. He tries to maintain physical activity by running and using a home gym, although he no longer practices Jiu Jitsu due to time constraints with his family responsibilities.  No pain, masses, blood in stool, or vomiting blood. He occasionally experiences headaches.    Past Medical History:  Diagnosis Date   ADHD  (attention deficit hyperactivity disorder)    Anxiety    Depression    Diabetes mellitus without complication (HCC)    Hypertension    Type 1 diabetes (HCC)     Past Surgical History:  Procedure Laterality Date   WISDOM TOOTH EXTRACTION Bilateral 10/2023    Allergies  Allergen Reactions   Lidocaine  Other (See Comments)    He passed out , causes his blood pressure to run very low.    Outpatient Encounter Medications as of 02/17/2024  Medication Sig   benazepril  (LOTENSIN ) 10 MG tablet Take 1 tablet (10 mg total) by mouth daily.   buPROPion  (WELLBUTRIN  XL) 150 MG 24 hr tablet Take 1 tablet (150 mg total) by mouth daily.   Continuous Blood Gluc Receiver (FREESTYLE LIBRE 3 READER) DEVI 1 each by Does not apply route as directed. DX: E10.9   Continuous Glucose Sensor (FREESTYLE LIBRE 3 SENSOR) MISC APPLY 1 PATCH TOPICALLY EVERY 14 DAYS TO  CHECK  GLUCOSE  CONTINUOUSLY   hydrOXYzine  (ATARAX ) 10 MG tablet Take 10 mg - 20 mg (1-2 tablets) three times daily as needed for anxiety or sleep   insulin  glargine (LANTUS ) 100 UNIT/ML injection Inject 0.2 mLs (20 Units total) into the skin daily.   NOVOLOG  100 UNIT/ML injection Inject 6-25 Units into the skin 3 (three) times daily before meals. 150-200 = 6 units, 200-250 = 10 units, 250 - 300 = 14 units, 300 - 350 = 18 units, 350-400 = 20 units,  400-450 = 22 units, 450- 500 = 25 units   RELION INSULIN  SYRINGE 1ML/31G 31G X 5/16 1 ML MISC Use for insulin  once daily. Dx: E10.9   No facility-administered encounter medications on file as of 02/17/2024.    Review of Systems:  Review of Systems  Constitutional:  Negative for activity change, appetite change and fever.  HENT:  Negative for sore throat.   Eyes: Negative.   Cardiovascular:  Negative for chest pain and leg swelling.  Gastrointestinal:  Negative for abdominal distention, diarrhea and vomiting.  Genitourinary:  Negative for dysuria, frequency and urgency.  Skin:  Negative for color change.   Neurological:  Negative for dizziness and headaches.  Psychiatric/Behavioral:  Negative for behavioral problems and sleep disturbance. The patient is not nervous/anxious.     Health Maintenance  Topic Date Due   COVID-19 Vaccine (1) Never done   OPHTHALMOLOGY EXAM  Never done   Pneumococcal Vaccine: 19-49 Years (1 of 2 - PCV) Never done   Hepatitis B Vaccines (1 of 3 - 19+ 3-dose series) Never done   HPV VACCINES (1 - Risk 3-dose SCDM series) Never done   INFLUENZA VACCINE  02/17/2024   FOOT EXAM  05/10/2024   HEMOGLOBIN A1C  05/19/2024   Diabetic kidney evaluation - eGFR measurement  11/16/2024   Diabetic kidney evaluation - Urine ACR  11/16/2024   DTaP/Tdap/Td (3 - Td or Tdap) 06/23/2027   Hepatitis C Screening  Completed   HIV Screening  Completed   Meningococcal B Vaccine  Aged Out    Physical Exam: Vitals:   02/17/24 1110  BP: 132/68  Pulse: 77  Resp: 18  Temp: (!) 97.2 F (36.2 C)  SpO2: 98%  Weight: 212 lb 6.4 oz (96.3 kg)  Height: 5' 7 (1.702 m)   Body mass index is 33.27 kg/m. Physical Exam Constitutional:      General: He is not in acute distress.    Appearance: He is obese.  HENT:     Head: Normocephalic and atraumatic.     Mouth/Throat:     Mouth: Mucous membranes are moist.  Eyes:     Conjunctiva/sclera: Conjunctivae normal.  Cardiovascular:     Rate and Rhythm: Normal rate and regular rhythm.     Pulses: Normal pulses.     Heart sounds: Normal heart sounds.  Pulmonary:     Effort: Pulmonary effort is normal.     Breath sounds: Normal breath sounds.  Abdominal:     General: Bowel sounds are normal.     Palpations: Abdomen is soft.  Musculoskeletal:        General: No swelling. Normal range of motion.     Cervical back: Normal range of motion.  Skin:    General: Skin is warm and dry.  Neurological:     General: No focal deficit present.     Mental Status: He is alert and oriented to person, place, and time.  Psychiatric:        Mood and  Affect: Mood normal.        Behavior: Behavior normal.        Thought Content: Thought content normal.        Judgment: Judgment normal.     Labs reviewed: Basic Metabolic Panel: Recent Labs    05/18/23 0848 11/17/23 1233  NA 139 140  K 3.9 3.5  CL 102 104  CO2 28 30  GLUCOSE 47* 55*  BUN 28* 22  CREATININE 1.17 1.11  CALCIUM 10.0 9.6  Liver Function Tests: Recent Labs    05/18/23 0848  AST 29  ALT 21  BILITOT 0.9  PROT 7.2   No results for input(s): LIPASE, AMYLASE in the last 8760 hours. No results for input(s): AMMONIA in the last 8760 hours. CBC: Recent Labs    05/18/23 0848 11/17/23 1233  WBC 6.5 6.5  NEUTROABS 3,796 3,874  HGB 16.3 15.0  HCT 48.8 45.1  MCV 89.2 89.7  PLT 238 220   Lipid Panel: Recent Labs    11/17/23 1233  CHOL 177  HDL 58  LDLCALC 108*  TRIG 36  CHOLHDL 3.1   Lab Results  Component Value Date   HGBA1C 7.3 (H) 11/17/2023    Procedures since last visit: No results found.  Assessment/Plan  1. Insulin  dependent diabetes mellitus type IA (HCC) (Primary) -  Blood sugar levels fluctuate significantly. Current A1c is 7.3%. Difficulty managing insulin  with food intake. - Schedule A1c test on August 8th, fasting required. - Perform kidney function tests and metabolic panel on August 8th. - Encouraged follow-up with endocrinologist on October 28th - Hemoglobin A1C; Future  2. Anxiety -  Managed with bupropion , recently increased to 150 mg daily. -  Hydroxyzine  causes significant sedation, affecting daily functioning. Anxiety may be exacerbated by stress. - Contact psychiatrist to discuss alternative medication options that do not cause sedation.   3. Primary hypertension -  Blood pressure is well-controlled with Lotensin  10 mg daily. Home readings consistent with today's measurement.  - Basic metabolic panel; Future    Labs/tests ordered:   A1C and BMP   Return in about 3 months (around 05/19/2024).  Helane Briceno  Medina-Vargas, NP

## 2024-02-24 ENCOUNTER — Other Ambulatory Visit

## 2024-02-24 DIAGNOSIS — E109 Type 1 diabetes mellitus without complications: Secondary | ICD-10-CM

## 2024-02-24 DIAGNOSIS — I10 Essential (primary) hypertension: Secondary | ICD-10-CM

## 2024-03-07 ENCOUNTER — Other Ambulatory Visit: Payer: PRIVATE HEALTH INSURANCE

## 2024-03-22 ENCOUNTER — Encounter (HOSPITAL_COMMUNITY): Payer: Self-pay | Admitting: Registered Nurse

## 2024-03-22 ENCOUNTER — Telehealth (HOSPITAL_COMMUNITY): Payer: PRIVATE HEALTH INSURANCE | Admitting: Registered Nurse

## 2024-03-22 ENCOUNTER — Telehealth (HOSPITAL_COMMUNITY): Payer: Self-pay | Admitting: *Deleted

## 2024-03-22 DIAGNOSIS — F411 Generalized anxiety disorder: Secondary | ICD-10-CM

## 2024-03-22 DIAGNOSIS — F331 Major depressive disorder, recurrent, moderate: Secondary | ICD-10-CM | POA: Diagnosis not present

## 2024-03-22 DIAGNOSIS — G4700 Insomnia, unspecified: Secondary | ICD-10-CM

## 2024-03-22 MED ORDER — TRAZODONE HCL 50 MG PO TABS: 50.0000 mg | ORAL_TABLET | Freq: Every evening | ORAL | 1 refills | Status: DC | PRN

## 2024-03-22 MED ORDER — BUSPIRONE HCL 5 MG PO TABS
5.0000 mg | ORAL_TABLET | Freq: Two times a day (BID) | ORAL | 1 refills | Status: DC
Start: 2024-03-22 — End: 2024-05-10

## 2024-03-22 MED ORDER — PROPRANOLOL HCL 10 MG PO TABS: 10.0000 mg | ORAL_TABLET | Freq: Two times a day (BID) | ORAL | 1 refills | Status: DC | PRN

## 2024-03-22 MED ORDER — BUPROPION HCL ER (XL) 150 MG PO TB24
150.0000 mg | ORAL_TABLET | Freq: Every day | ORAL | 1 refills | Status: DC
Start: 2024-03-22 — End: 2024-05-10

## 2024-03-22 NOTE — Patient Instructions (Signed)
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
## 2024-03-22 NOTE — Telephone Encounter (Signed)
 Called patient to schedule 2 wks f/u with Shuvon due to provider's request. Staff was not able to reach patient and lmom and office number was provided.

## 2024-04-21 ENCOUNTER — Other Ambulatory Visit: Payer: Self-pay | Admitting: Adult Health

## 2024-04-21 DIAGNOSIS — I10 Essential (primary) hypertension: Secondary | ICD-10-CM

## 2024-05-04 ENCOUNTER — Telehealth: Payer: PRIVATE HEALTH INSURANCE | Admitting: Physician Assistant

## 2024-05-04 ENCOUNTER — Ambulatory Visit: Payer: Self-pay

## 2024-05-04 DIAGNOSIS — B9689 Other specified bacterial agents as the cause of diseases classified elsewhere: Secondary | ICD-10-CM

## 2024-05-04 DIAGNOSIS — J019 Acute sinusitis, unspecified: Secondary | ICD-10-CM

## 2024-05-04 MED ORDER — AMOXICILLIN-POT CLAVULANATE 875-125 MG PO TABS: 1.0000 | ORAL_TABLET | Freq: Two times a day (BID) | ORAL | 0 refills | Status: AC

## 2024-05-04 MED ORDER — FLUTICASONE PROPIONATE 50 MCG/ACT NA SUSP: 2.0000 | Freq: Every day | NASAL | 6 refills | Status: DC

## 2024-05-04 MED ORDER — LEVOCETIRIZINE DIHYDROCHLORIDE 5 MG PO TABS: 5.0000 mg | ORAL_TABLET | Freq: Every evening | ORAL | 0 refills | Status: DC

## 2024-05-04 NOTE — Telephone Encounter (Signed)
 Message routed to PCP Medina-Vargas, Monina C, NP as FYI.
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
## 2024-05-04 NOTE — Telephone Encounter (Signed)
 FYI Only or Action Required?: FYI only for provider.  Patient was last seen in primary care on 02/17/2024 by Medina-Vargas, Jereld BROCKS, NP.  Called Nurse Triage reporting Facial Pain.  Symptoms began 2 weeks ago.  Interventions attempted: OTC medications: Tylenol , Zicam, Alka-seltzer and Rest, hydration, or home remedies.  Symptoms are: stable.  Triage Disposition: See PCP When Office is Open (Within 3 Days)  Patient/caregiver understands and will follow disposition?: Yes Reason for Disposition  [1] Sinus congestion (pressure, fullness) AND [2] present > 10 days  Answer Assessment - Initial Assessment Questions OTC Alka-seltzer, Tylenol , Zicam.  1. LOCATION: Where does it hurt?      Temples, around eyes, nose, cheekbones  2. ONSET: When did the sinus pain start?  (e.g., hours, days)      2 weeks  3. SEVERITY: How bad is the pain?   (Scale 0-10; or none, mild, moderate or severe)     Mild to moderate  4. NASAL CONGESTION: Is the nose blocked? If Yes, ask: Can you open it or must you breathe through your mouth?     Left nostril completely blocked  5. NASAL DISCHARGE: Do you have discharge from your nose? If so ask, What color?     Green   6. FEVER: Do you have a fever? If Yes, ask: What is it, how was it measured, and when did it start?      Denies, felt a little hot but never checked temp  7. OTHER SYMPTOMS: Do you have any other symptoms? (e.g., sore throat, cough, earache, difficulty breathing)     First week, sore throat for 3-4 days, just a lot of sinus pressure, and sinus / chest congestion  Protocols used: Sinus Pain or Congestion-A-AH  Copied from CRM #[id number]. Topic: Clinical - Red Word Triage >> May 04, 2024 10:30 AM Miquel SAILOR wrote: Red Word that prompted transfer to Nurse Triage: Sever sinus pressure /Congestion for 2 weeks/Having trouble sleeping

## 2024-05-04 NOTE — Telephone Encounter (Signed)
 Noted

## 2024-05-04 NOTE — Patient Instructions (Signed)
 Shawn Kemp, thank you for joining [name], PA-C for today's virtual visit.  While this provider is not your primary care provider (PCP), if your PCP is located in our provider database this encounter information will be shared with them immediately following your visit.   A [facility] MyChart account gives you access to today's visit and all your visits, tests, and labs performed at [facility]  click here if you don't have a [facility] MyChart account or go to [email or web address]  Consent: (Patient) Shawn Kemp provided verbal consent for this virtual visit at the beginning of the encounter.  Current Medications:  Current Outpatient Medications:    benazepril  (LOTENSIN ) 10 MG tablet, Take 1 tablet by mouth once daily, Disp: 90 tablet, Rfl: 0   buPROPion  (WELLBUTRIN  XL) 150 MG 24 hr tablet, Take 1 tablet (150 mg total) by mouth daily., Disp: 30 tablet, Rfl: 1   busPIRone  (BUSPAR ) 5 MG tablet, Take 1 tablet (5 mg total) by mouth 2 (two) times daily., Disp: 60 tablet, Rfl: 1   Continuous Blood Gluc Receiver (FREESTYLE LIBRE 3 READER) DEVI, 1 each by Does not apply route as directed. DX: E10.9, Disp: 1 each, Rfl: 0   Continuous Glucose Sensor (FREESTYLE LIBRE 3 SENSOR) MISC, APPLY 1 PATCH TOPICALLY EVERY 14 DAYS TO  CHECK  GLUCOSE  CONTINUOUSLY, Disp: 2 each, Rfl: 4   insulin  glargine (LANTUS ) 100 UNIT/ML injection, Inject 0.2 mLs (20 Units total) into the skin daily., Disp: 10 mL, Rfl: 11   NOVOLOG  100 UNIT/ML injection, Inject 6-25 Units into the skin 3 (three) times daily before meals. 150-200 = 6 units, 200-250 = 10 units, 250 - 300 = 14 units, 300 - 350 = 18 units, 350-400 = 20 units, 400-450 = 22 units, 450- 500 = 25 units, Disp: , Rfl:    propranolol  (INDERAL ) 10 MG tablet, Take 1 tablet (10 mg total) by mouth 2 (two) times daily as needed (anxiety)., Disp: 60 tablet, Rfl: 1   RELION INSULIN  SYRINGE 1ML/31G 31G X 5/16 1 ML MISC, Use for insulin  once daily.  Dx: E10.9, Disp: 100 each, Rfl: 3   traZODone  (DESYREL ) 50 MG tablet, Take 1 tablet (50 mg total) by mouth at bedtime as needed for sleep., Disp: 30 tablet, Rfl: 1   Medications ordered in this encounter:  No orders of the defined types were placed in this encounter.    *If you need refills on other medications prior to your next appointment, please contact your pharmacy*  Follow-Up: Call back or seek an in-person evaluation if the symptoms worsen or if the condition fails to improve as anticipated.  [facility] [phone]  Other Instructions Please report to the nearest Emergency room with any worsening symptoms. Follow up with primary care provider (PCP) in 2 -3 days.    If you have been instructed to have an in-person evaluation today at a local Urgent Care facility, please use the link below. It will take you to a list of all of our available [facility], including address, phone number and hours of operation. Please do not delay care.  [facility]  If you or a family member do not have a primary care provider, use the link below to schedule a visit and establish care. When you choose a [facility] primary care physician or advanced practice provider, you gain a long-term partner in health. Find a Primary Care Provider  Learn more about [facility]'s in-office  and virtual care options: [facility] - Get Care Now

## 2024-05-10 ENCOUNTER — Telehealth (HOSPITAL_COMMUNITY): Payer: PRIVATE HEALTH INSURANCE | Admitting: Registered Nurse

## 2024-05-10 ENCOUNTER — Other Ambulatory Visit: Payer: Self-pay | Admitting: Adult Health

## 2024-05-10 ENCOUNTER — Encounter (HOSPITAL_COMMUNITY): Payer: Self-pay | Admitting: Registered Nurse

## 2024-05-10 DIAGNOSIS — F331 Major depressive disorder, recurrent, moderate: Secondary | ICD-10-CM | POA: Diagnosis not present

## 2024-05-10 DIAGNOSIS — F411 Generalized anxiety disorder: Secondary | ICD-10-CM

## 2024-05-10 DIAGNOSIS — G4700 Insomnia, unspecified: Secondary | ICD-10-CM

## 2024-05-10 DIAGNOSIS — F419 Anxiety disorder, unspecified: Secondary | ICD-10-CM

## 2024-05-10 MED ORDER — BUPROPION HCL 75 MG PO TABS: 75.0000 mg | ORAL_TABLET | Freq: Two times a day (BID) | ORAL | 0 refills | Status: DC

## 2024-05-10 NOTE — Patient Instructions (Addendum)
 If no one has contacted, you by the end of business day today please call the appropriate office listed below to schedule your next visit for medication management with Luisa Ruder, NP:    [facility] at [facility] [address], [place], [place] [address]  5.4 mi Phone: [phone] (Call to schedule appointment)  [facility] at [facility] [address] [place], [place] [address]  25 mi Phone: [phone] (Call to schedule appointment)  Call 911, [phone], mobile crisis, or present to the nearest emergency room should you experience any suicidal/homicidal ideation, auditory/visual/hallucinations, or detrimental worsening of your mental health.  Mobile Crisis Response Teams Listed by counties in vicinity of [facility] providers [facility] [phone] [facility] [phone] [facility] [phone] [facility] [phone] [place]                * [facility] [phone]                * [employer] [phone]  [facility] [phone] [facility] [employer]  [phone] * [employer] [phone]     Delta-8  and Delta-9  Tetrahydrocannabinol [facility]) has psychoactive and intoxicating effect. THC is a cannabinoid, a category of chemicals that interact with the body's endocannabinoid system. By attaching to cannabinoid receptors in the brain, THC activates neurons that influence pleasure, memory, thinking, coordination, and time perception.  Delta-8/9 THC products have not been evaluated or approved by the FDA for safe use and may be marketed in ways that put the public health at risk.  Some concerns include variability in product formulations and product  labeling, other cannabinoid and terpene content, and variable Delta-8/9 THC concentrations.    Side effects or adverse reactions to Delta 8 and Delta 9 Dry mouth. Vomiting. Trouble standing. Loss of consciousness. Memory loss. Hallucinations. Trouble with coordination. Changes in appetite and weight. Plus, there are other risks associated with getting high, drug tests, legality, and potential poisoning.  Information on the use of CBD, Delta 8 and Delta 9 adverse reactions and mental health. U.S. Food and Drug Administration recognizes THC as a psychoactive substance.  There has been an increase reporting of adverse reactions of increased anxiety confusion, and loss of consciousness.  Studies have also shown euphoric.  There has also bee There is a strong body of evidence that associates the use of THC with psychosis with the greatest risk stemming from early initiation and frequent use of high potency.  There is continued debate on whether there is a causality between [facility] use and the emergence of schizophrenia.  Genetic predisposition may also lead users of cannabis to experience worsening in psychotic like experiences.  Furthermore, long term uses of THC can lead to neuropsychological impairment, including reduced executive functioning and difficulties with concentration and memory that may or may not be reversible with abstinence.  Based on research on Delta 8 and 9-THC there is a possibility that delta 8 could also predispose to, or bring out, psychosis and neuropsychological decline similar to delta 9  Over 180 milling people use Cannabis (marijuana) or cannabis containing products worldwide even though the use of cannabis can potentially lead to dependency, cognitive impairment, and increased risk of psychotic illness.   However, most people who try cannabis don't experience prolonged adverse effects.  There are several factors that predict vulnerability such as adolescent  exposure, frequency  of use, type of cannabis used, and schizophrenia or schizotypy (the personality construct that indicates a person's likelihood of developing schizophrenia or other psychotic disorder).     Cannabis containing high levels of delta 8/9-tetrahydrocannabinol (THC) and little if any cannabidiol (CBD) have become increasingly assessable and is inked to cannabis dependency, memory impairment and paranoia, with an increased risk of psychotic illness.  Delta-8/9-THC produces the "stoned like drug effect" and "want more drug".  THC produces robust, dose-dependent impairments in immediate and delayed verbal memory and transient positive and negative symptoms like schizophrenia.    While CBD is mostly considered to have marginal psychoactive effects, both delta 8 and Delta 9 THC can have adverse mental health reactions including anxiety, paranoia, hallucinations, confusion, and increased psychotic symptoms, especially in individuals with a predisposition to mental health issues; with delta 8/9 THC often having unpredictable effects due to its lack of regulation.  Key points about each cannabinoid and mental health: CBD:   While thought to be well-tolerated, high doses of CBD may cause drowsiness, fatigue, and in some cases, anxiety or paranoia, especially in individuals with a history of psychosis.    Delta-8 THC:   Due to its psychoactive properties, Delta-8 THC can trigger anxiety, paranoia, confusion, hallucinations, and worsening of psychotic symptoms, like Delta-9 THC but potentially with greater variability in effects due to lack of regulation.  Delta-9 THC:   The primary psychoactive component of cannabis, Delta-9 THC is most likely to cause significant mental health adverse effects including acute psychosis, anxiety, paranoia, and heightened sensitivity to stimuli, particularly in individuals with a predisposition to mental health issues.  Important considerations: CBD is unlike THC not binding to the  cerebral CB1 receptor which is responsible for the rewarding effects of cannabis in its natural form and for the feeling of euphoria. Many articles state that CBD is a non-psycho-active substance [3-5]. However, it must have some degree of psycho-activity if it alters people's mood, anxiety as well as cognition.  As with many other substances listed in the DSM V for substance use disorders, it is also possible with CBD to experience a loss of control and a compulsive use. Even though the risk of lethality is very low, it does not mean it is impossible to become addicted to the substance.   Withdrawal symptoms are not well described in the literature but an example would be a person who takes CBD regularly for insomnia, will experience rebound insomnia if he/she stops.   References: http://jones-macias.[email or web address] [place].com.br
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
## 2024-05-10 NOTE — Telephone Encounter (Signed)
 Medication has never been prescribed by patients pcp. Sending medication to patients pcp

## 2024-05-15 ENCOUNTER — Ambulatory Visit: Admitting: Endocrinology

## 2024-05-25 ENCOUNTER — Ambulatory Visit: Payer: Self-pay | Admitting: Adult Health

## 2024-05-25 ENCOUNTER — Other Ambulatory Visit: Payer: Self-pay | Admitting: Adult Health

## 2024-05-25 DIAGNOSIS — E109 Type 1 diabetes mellitus without complications: Secondary | ICD-10-CM

## 2024-05-28 ENCOUNTER — Other Ambulatory Visit: Payer: PRIVATE HEALTH INSURANCE

## 2024-05-28 ENCOUNTER — Ambulatory Visit: Payer: PRIVATE HEALTH INSURANCE | Admitting: Adult Health

## 2024-05-28 ENCOUNTER — Ambulatory Visit (INDEPENDENT_AMBULATORY_CARE_PROVIDER_SITE_OTHER): Payer: PRIVATE HEALTH INSURANCE | Admitting: Endocrinology

## 2024-05-28 ENCOUNTER — Encounter: Payer: Self-pay | Admitting: Endocrinology

## 2024-05-28 ENCOUNTER — Ambulatory Visit: Payer: Self-pay | Admitting: Endocrinology

## 2024-05-28 VITALS — BP 116/70 | HR 74 | Resp 20 | Ht 67.0 in | Wt 211.0 lb

## 2024-05-28 DIAGNOSIS — E109 Type 1 diabetes mellitus without complications: Secondary | ICD-10-CM | POA: Diagnosis not present

## 2024-05-28 LAB — POCT GLYCOSYLATED HEMOGLOBIN (HGB A1C): Hemoglobin A1C: 7.2 % — AB (ref 4.0–5.6)

## 2024-05-28 NOTE — Progress Notes (Unsigned)
 Outpatient Endocrinology Note Jerilee Space, MD   Patient's Name: Shawn Kemp    DOB: 06-Mar-1991    MRN: [id number]                                                    REASON OF VISIT: New consult / Follow up for type 1 diabetes mellitus  REFERRING PROVIDER: Medina-Vargas, Monina C, NP  PCP: Medina-Vargas, Jereld BROCKS, NP  HISTORY OF PRESENT ILLNESS:   Shawn Kemp is a 33 y.o. old male with past medical history listed below, is here for new consult for type 1 diabetes mellitus.   Pertinent Diabetes History: Patient is referred to endocrinology for evaluation and management of type 1 diabetes mellitus.  Initial consult on May 28, 2024.    He was diagnosed with type 1 diabetes mellitus at the age of 40, he has mildly uncontrolled type 1 diabetes mellitus with hemoglobin A1c mostly in the range of 7%.  At the time of diagnosis he had polyuria, polydipsia, fatigue and weight loss, was evaluated in the outside facility hospital and had hyperglycemia likely diabetes ketoacidosis and diagnosed as type 1 diabetes mellitus.  No records of autoantibodies available to review, also checked in Care Everywhere, not available.  Chronic Diabetes Complications : Retinopathy: no. Last ophthalmology exam was done on Due, following with ophthalmology regularly.  Nephropathy: no, on ACE/ARB / Benazepril  Peripheral neuropathy: no Coronary artery disease: on Stroke: no  Relevant comorbidities and cardiovascular risk factors: Obesity : no Body mass index is 33.05 kg/m.  Muscular Hypertension: Yes  Hyperlipidemia : Yes, on statin   Current / Home Diabetic regimen includes:  Lantus  20 units daily at bedtime. Novolin R 3-10 units with meals 3 times a day.   Prior diabetic medications: He had taken Humalog in the past, preferred to stay on Novolin R due to cost effectiveness.  Glycemic data:    CONTINUOUS GLUCOSE MONITORING SYSTEM (CGMS) INTERPRETATION:  He has been using freestyle libre  3+ CGM, not able to download in the clinic today review.  Reviewed glucose data from the phone app, mostly variable blood sugar usually hyperglycemia following breakfast and low normal to hypoglycemia in the early afternoon and relatively acceptable blood sugar overnight and in the evening.  Hypoglycemia: Patient has minor hypoglycemic episodes. Patient has hypoglycemia awareness.  Factors modifying glucose control: 1.  Diabetic diet assessment: 3 meals a day.  2.  Staying active or exercising: Physically active at work, on a sports and martial arts and also gym.  3.  Medication compliance: compliant all of the time.  Interval history  Initial visit today.  Patient presented to establish endocrinology care for type 1 diabetes mellitus.  Not able to download and review glucose data however some of the glucose data reviewed from his mobile app as mentioned above.  Diabetes has been as reviewed and noted above.  Hemoglobin A1c today 7.2%.  He has no vision problem.  No numbness and tingling of the feet.  REVIEW OF SYSTEMS As per history of present illness.   PAST MEDICAL HISTORY: Past Medical History:  Diagnosis Date   ADHD (attention deficit hyperactivity disorder)    Anxiety    Depression    Diabetes mellitus without complication (HCC)    Hypertension    Type 1 diabetes (HCC)     PAST SURGICAL  HISTORY: Past Surgical History:  Procedure Laterality Date   WISDOM TOOTH EXTRACTION Bilateral 10/2023    ALLERGIES: Allergies  Allergen Reactions   Lidocaine  Other (See Comments)    He passed out , causes his blood pressure to run very low.    FAMILY HISTORY:  Family History  Problem Relation Age of Onset   Alcohol abuse Mother    Drug abuse Mother    Bipolar disorder Mother    Hyperlipidemia Mother    Hypertension Father    Heart Problems Father    Healthy Sister    Healthy Brother    Healthy Maternal Aunt    Healthy Paternal Aunt    Healthy Maternal Uncle    Healthy  Paternal Uncle    Healthy Maternal Grandfather    Healthy Maternal Grandmother    Healthy Paternal Grandfather    Healthy Paternal Grandmother    Healthy Cousin    Heart Problems Son    Heart Problems Child    Healthy Other    ADD / ADHD Neg Hx    Anxiety disorder Neg Hx    Dementia Neg Hx    Depression Neg Hx    OCD Neg Hx    Paranoid behavior Neg Hx    Physical abuse Neg Hx    Schizophrenia Neg Hx    Seizures Neg Hx    Sexual abuse Neg Hx     SOCIAL HISTORY: Social History   Socioeconomic History   Marital status: Married    Spouse name: Not on file   Number of children: 5   Years of education: GED   Highest education level: GED or equivalent  Occupational History   Not on file  Tobacco Use   Smoking status: Never    Passive exposure: Never   Smokeless tobacco: Not on file  Vaping Use   Vaping status: Never Used  Substance and Sexual Activity   Alcohol use: No   Drug use: Not Currently   Sexual activity: Yes  Other Topics Concern   Not on file  Social History Narrative   Lives with wife and 5 children, ages from 39-14 y/o   Social Drivers of Corporate Investment Banker Strain: Not on file  Food Insecurity: Not on file  Transportation Needs: Not on file  Physical Activity: Not on file  Stress: Not on file  Social Connections: Not on file    MEDICATIONS:  Current Outpatient Medications  Medication Sig Dispense Refill   benazepril  (LOTENSIN ) 10 MG tablet Take 1 tablet by mouth once daily 90 tablet 0   buPROPion  (WELLBUTRIN ) 75 MG tablet Take 1 tablet (75 mg total) by mouth 2 (two) times daily. 180 tablet 0   Continuous Blood Gluc Receiver (FREESTYLE LIBRE 3 READER) DEVI 1 each by Does not apply route as directed. DX: E10.9 1 each 0   Continuous Glucose Sensor (FREESTYLE LIBRE 3 PLUS SENSOR) MISC APPLY 1 SENSOR EVERY 15 DAYS 2 each 0   insulin  glargine (LANTUS ) 100 UNIT/ML injection Inject 0.2 mLs (20 Units total) into the skin daily. 10 mL 11   insulin   regular (NOVOLIN R) 100 units/mL injection Inject 5-10 Units into the skin 3 (three) times daily before meals.     RELION INSULIN  SYRINGE 1ML/31G 31G X 5/16 1 ML MISC Use for insulin  once daily. Dx: E10.9 100 each 3   No current facility-administered medications for this visit.    PHYSICAL EXAM: Vitals:   05/28/24 1323  BP: 116/70  Pulse: 74  Resp: 20  SpO2: 98%  Weight: 211 lb (95.7 kg)  Height: 5' 7 (1.702 m)   Body mass index is 33.05 kg/m.  Wt Readings from Last 3 Encounters:  05/28/24 211 lb (95.7 kg)  02/17/24 212 lb 6.4 oz (96.3 kg)  11/17/23 204 lb 9.6 oz (92.8 kg)    General: Well developed, well nourished male in no apparent distress.  HEENT: AT/[place], no external lesions.  Eyes: Conjunctiva clear and no icterus. Neck: Neck supple  Lungs: Respirations not labored Neurologic: Alert, oriented, normal speech Extremities / Skin: Dry.  Psychiatric: Does not appear depressed or anxious  Diabetic Foot Exam - Simple   No data filed    LABS Reviewed Lab Results  Component Value Date   HGBA1C 7.2 (A) 05/28/2024   HGBA1C 7.3 (H) 11/17/2023   HGBA1C 6.9 (H) 05/18/2023   No results found for: FRUCTOSAMINE Lab Results  Component Value Date   CHOL 177 11/17/2023   HDL 58 11/17/2023   LDLCALC 108 (H) 11/17/2023   TRIG 36 11/17/2023   CHOLHDL 3.1 11/17/2023   Lab Results  Component Value Date   MICRALBCREAT 2 11/17/2023   MICRALBCREAT 3 10/08/2022   Lab Results  Component Value Date   CREATININE 1.11 11/17/2023   No results found for: GFR  ASSESSMENT / PLAN  1. Type 1 diabetes mellitus without complications (HCC)    Diabetes Mellitus type 1, complicated by no known complications. - Diabetic status / severity: Uncontrolled. Lab Results  Component Value Date   HGBA1C 7.2 (A) 05/28/2024    - Hemoglobin A1c goal : <6.5%  Discussed about type 1 diabetes mellitus and potential chronic diabetic complication including diabetic retinopathy, neuropathy  and nephropathy.  Discussed that being type I he can have variable blood sugar.  He is on a sports on martial art and regular gym with exercise reports having variable blood sugar with hypoglycemia.  Discussed that insulin  pump therapy would be a better option than multidose insulin  regimen to have variable insulin  based on his physical activity, exercise and eating, the management of type 1 diabetes mellitus.  He wants to have insulin  pump that can be disconnected temporarily for short time 30 minutes to 1 to 2 hours when he is on contact sports for example martial arts.  No records of type I autoantibodies available to review.  Will have lab for type and autoantibodies, C-peptide and glucose today.  After the lab results we will plan for tandem t:slim insulin  pump, will contact representative for checking his benefits and coverage.  - Medications: See below.  I) continue Lantus  20 units daily at bedtime. II) continue Novolin R 5 to 10 units with meals plus sliding scale as follows.  Advised patient to take low-dose of Novolin R with lunch as he eats lunch after 2 to 3 hours of breakfast.  To avoid to stacking of insulin  and causing hypoglycemia in the early afternoon.  III) initiate plan for insulin  pump therapy, tandem t:slim.  - Home glucose testing: Continue freestyle libre 3+ CGM and check as needed.  He updated over lab today, we will be able to download his CGM data in the future.  - Discussed/ Gave Hypoglycemia treatment plan.  # Consult : Refer to diabetic educator for type 1 diabetes mellitus/carb counting and ultimately insulin  pump training.  # Annual urine for microalbuminuria/ creatinine ratio, no microalbuminuria currently, continue ACE/ARB benazepril . Last  Lab Results  Component Value Date   MICRALBCREAT 2 11/17/2023    #  Foot check nightly.  # Annual dilated diabetic eye exams.  He is due for it.  Advised to have diabetic eye exam.  - Diet: Make healthy diabetic  food choices - Life style / activity / exercise: Discussed.  2. Blood pressure  -  BP Readings from Last 1 Encounters:  05/28/24 116/70    - Control is in target.  - No change in current plans.  3. Lipid status / Hyperlipidemia - Last  Lab Results  Component Value Date   LDLCALC 108 (H) 11/17/2023   - Consider restarting therapy in the future.  Diagnoses and all orders for this visit:  Type 1 diabetes mellitus without complications (HCC) -     POCT glycosylated hemoglobin (Hb A1C) -     Glutamic acid decarboxylase auto abs -     C-peptide -     Glucose, random -     ZNT8 Antibodies -     IA-2 Antibody -     Amb Referral to Nutrition and Diabetic Education    DISPOSITION Follow up in clinic in 3  months suggested.   All questions answered and patient verbalized understanding of the plan.  Mylz Yuan, MD [facility] Endocrinology [facility] [address] [place], [address] [place], [place] [address] Phone # [phone]  At least part of this note was generated using voice recognition software. Inadvertent word errors may have occurred, which were not recognized during the proofreading process.

## 2024-05-28 NOTE — Patient Instructions (Signed)
 Diabetes regimen:  Lantus  20 units daily. Novolin R 3 - 10 units with plus sliding scale three times a day.   Sliding Scale Blood Glucose        Insulin  60-150                     None 151-200                   None 201-250                   1 units 251-300                   3 units 301-350                   4 units 351-400                   5 units      >400                        6 units and call provider   Will plan for Tandem insulin  pump.

## 2024-05-31 ENCOUNTER — Ambulatory Visit: Payer: PRIVATE HEALTH INSURANCE | Admitting: Adult Health

## 2024-06-03 LAB — IA-2 ANTIBODY: IA-2 Antibody: 302.3 U/mL — ABNORMAL HIGH (ref <5.4)

## 2024-06-03 LAB — ZNT8 ANTIBODIES: ZNT8 Antibodies: 30 U/mL — ABNORMAL HIGH (ref <15)

## 2024-06-03 LAB — C-PEPTIDE: C-Peptide: 0.17 ng/mL — ABNORMAL LOW (ref 0.80–3.85)

## 2024-06-03 LAB — GLUCOSE, RANDOM: Glucose, Plasma: 56 mg/dL — ABNORMAL LOW (ref 65–139)

## 2024-06-03 LAB — GLUTAMIC ACID DECARBOXYLASE AUTO ABS: Glutamic Acid Decarb Ab: 36 [IU]/mL — ABNORMAL HIGH (ref <5)

## 2024-06-05 ENCOUNTER — Ambulatory Visit: Payer: PRIVATE HEALTH INSURANCE | Admitting: Adult Health

## 2024-06-05 ENCOUNTER — Encounter: Payer: Self-pay | Admitting: Adult Health

## 2024-06-05 VITALS — BP 120/80 | HR 69 | Temp 97.6°F | Resp 18 | Ht 67.0 in | Wt 208.6 lb

## 2024-06-05 DIAGNOSIS — I10 Essential (primary) hypertension: Secondary | ICD-10-CM | POA: Diagnosis not present

## 2024-06-05 DIAGNOSIS — F419 Anxiety disorder, unspecified: Secondary | ICD-10-CM | POA: Diagnosis not present

## 2024-06-05 DIAGNOSIS — E109 Type 1 diabetes mellitus without complications: Secondary | ICD-10-CM

## 2024-06-05 NOTE — Progress Notes (Signed)
 -  [facility] clinic  Provider:  Jereld Serum DNP  Code Status:  Full Code  Goals of Care:     08/17/2023    9:03 AM  Advanced Directives  Does Patient Have a Medical Advance Directive? No  Would patient like information on creating a medical advance directive? No - Patient declined     Chief Complaint  Patient presents with   Medical Management of Chronic Issues    3 months follow-up    Discussed the use of AI scribe software for clinical note transcription with the patient, who gave verbal consent to proceed.  HPI: Patient is a 33 y.o. male seen today for an a 60-month follow up of chronic medical issues.   He manages his diabetes mellitus with Novolog  and Lantus , taking 20 units of Lantus  daily at bedtime and using Novolog  on a sliding scale, administering 5 to 10 units three times a day with meals. His last A1c was checked last week. His blood sugar was 230 mg/dL after a recent meal. He recently visited his endocrinologist, who is considering starting him on an insulin  pump.  His hypertension is well-controlled with benazepril  10 mg daily, with blood pressure readings typically around 120/80 mmHg. He occasionally experiences lightheadedness upon standing.  Regarding his anxiety, he has been meeting with a psychiatrist. He was previously prescribed hydroxyzine  and Buspar , which he did not tolerate well, experiencing increased sleepiness and feeling worse. He is currently on bupropion  75 mg twice a day but has been forgetting to take the evening dose, resulting in taking only 75 mg daily. He feels 'a little bit more on edge' but overall finds bupropion  effective.  He has not had an eye exam this year despite attempts to schedule one.    Past Medical History:  Diagnosis Date   ADHD (attention deficit hyperactivity disorder)    Anxiety    Depression    Diabetes mellitus without complication (HCC)    Hypertension    Type 1 diabetes (HCC)     Past Surgical History:   Procedure Laterality Date   WISDOM TOOTH EXTRACTION Bilateral 10/2023    Allergies  Allergen Reactions   Lidocaine  Other (See Comments)    He passed out , causes his blood pressure to run very low.    Outpatient Encounter Medications as of 06/05/2024  Medication Sig   benazepril  (LOTENSIN ) 10 MG tablet Take 1 tablet by mouth once daily   buPROPion  (WELLBUTRIN ) 75 MG tablet Take 1 tablet (75 mg total) by mouth 2 (two) times daily.   Continuous Blood Gluc Receiver (FREESTYLE LIBRE 3 READER) DEVI 1 each by Does not apply route as directed. DX: E10.9   Continuous Glucose Sensor (FREESTYLE LIBRE 3 PLUS SENSOR) MISC APPLY 1 SENSOR EVERY 15 DAYS   insulin  glargine (LANTUS ) 100 UNIT/ML injection Inject 0.2 mLs (20 Units total) into the skin daily.   insulin  regular (NOVOLIN R) 100 units/mL injection Inject 5-10 Units into the skin 3 (three) times daily before meals.   RELION INSULIN  SYRINGE 1ML/31G 31G X 5/16 1 ML MISC Use for insulin  once daily. Dx: E10.9   No facility-administered encounter medications on file as of 06/05/2024.    Review of Systems:  Review of Systems  Constitutional:  Negative for activity change, appetite change and fever.  HENT:  Negative for sore throat.   Eyes: Negative.   Cardiovascular:  Negative for chest pain and leg swelling.  Gastrointestinal:  Negative for abdominal distention, diarrhea and vomiting.  Genitourinary:  Negative for dysuria, frequency and urgency.  Skin:  Negative for color change.  Neurological:  Negative for dizziness and headaches.  Psychiatric/Behavioral:  Negative for behavioral problems and sleep disturbance. The patient is not nervous/anxious.     Health Maintenance  Topic Date Due   OPHTHALMOLOGY EXAM  Never done   Pneumococcal Vaccine (1 of 2 - PCV) 07/19/2024 (Originally 02/01/2009)   COVID-19 Vaccine (1) 07/19/2024 (Originally 02/02/1995)   Influenza Vaccine  10/16/2024 (Originally 02/17/2024)   Hepatitis B Vaccines 19-59  Average Risk (1 of 3 - 19+ 3-dose series) 06/05/2025 (Originally 02/01/2009)   HPV VACCINES (1 - Risk 3-dose SCDM series) 06/05/2025 (Originally 02/01/2017)   Diabetic kidney evaluation - eGFR measurement  11/16/2024   Diabetic kidney evaluation - Urine ACR  11/16/2024   HEMOGLOBIN A1C  11/25/2024   FOOT EXAM  06/05/2025   DTaP/Tdap/Td (3 - Td or Tdap) 06/23/2027   Hepatitis C Screening  Completed   HIV Screening  Completed   Meningococcal B Vaccine  Aged Out    Physical Exam: Vitals:   06/05/24 1130  BP: 120/80  Pulse: 69  Resp: 18  Temp: 97.6 F (36.4 C)  SpO2: 98%  Weight: 208 lb 9.6 oz (94.6 kg)  Height: 5' 7 (1.702 m)   Body mass index is 32.67 kg/m. Physical Exam Constitutional:      Appearance: Normal appearance.  HENT:     Head: Normocephalic and atraumatic.     Mouth/Throat:     Mouth: Mucous membranes are moist.  Eyes:     Conjunctiva/sclera: Conjunctivae normal.  Cardiovascular:     Rate and Rhythm: Normal rate and regular rhythm.     Pulses: Normal pulses.     Heart sounds: Normal heart sounds.  Pulmonary:     Effort: Pulmonary effort is normal.     Breath sounds: Normal breath sounds.  Abdominal:     General: Bowel sounds are normal.     Palpations: Abdomen is soft.  Musculoskeletal:        General: No swelling. Normal range of motion.     Cervical back: Normal range of motion.  Skin:    General: Skin is warm and dry.  Neurological:     General: No focal deficit present.     Mental Status: He is alert and oriented to person, place, and time.  Psychiatric:        Mood and Affect: Mood normal.        Behavior: Behavior normal.        Thought Content: Thought content normal.        Judgment: Judgment normal.     Labs reviewed: Basic Metabolic Panel: Recent Labs    11/17/23 1233  NA 140  K 3.5  CL 104  CO2 30  GLUCOSE 55*  BUN 22  CREATININE 1.11  CALCIUM 9.6   Liver Function Tests: No results for input(s): AST, ALT, ALKPHOS,  BILITOT, PROT, ALBUMIN in the last 8760 hours. No results for input(s): LIPASE, AMYLASE in the last 8760 hours. No results for input(s): AMMONIA in the last 8760 hours. CBC: Recent Labs    11/17/23 1233  WBC 6.5  NEUTROABS 3,874  HGB 15.0  HCT 45.1  MCV 89.7  PLT 220   Lipid Panel: Recent Labs    11/17/23 1233  CHOL 177  HDL 58  LDLCALC 108*  TRIG 36  CHOLHDL 3.1   Lab Results  Component Value Date   HGBA1C 7.2 (A) 05/28/2024    Procedures  since last visit: No results found.  Assessment/Plan  1. Insulin  dependent diabetes mellitus type IA (HCC) (Primary) -  Managed with NovoLog  and Lantus . Endocrinologist considering insulin  pump pending dietary consultation. - Continue current insulin  regimen with NovoLog  and Lantus . - Proceed with dietary consultation for insulin  pump transition. - Ordered BMP to assess kidney function. - Basic Metabolic Panel  2. Primary hypertension -  Well controlled with benazepril  10 mg daily.  - Continue benazepril  10 mg daily.  3. Anxiety -  Managed with bupropion  75 mg twice daily. Reports forgetting evening dose, increasing anxiety. Previous trials of hydroxyzine  and Buspar  not well tolerated. - Continue bupropion  75 mg twice daily.      General Health Maintenance Has not seen an eye doctor this year. Foot exam normal. - Encouraged scheduling an eye exam with existing provider.     Labs/tests ordered:  BMP   Return in about 6 months (around 12/03/2024), or if symptoms worsen or fail to improve.  Tatia Petrucci Medina-Vargas, NP

## 2024-06-06 LAB — BASIC METABOLIC PANEL WITH GFR
BUN/Creatinine Ratio: 17 (calc) (ref 6–22)
BUN: 24 mg/dL (ref 7–25)
CO2: 26 mmol/L (ref 20–32)
Calcium: 9.5 mg/dL (ref 8.6–10.3)
Chloride: 101 mmol/L (ref 98–110)
Creat: 1.41 mg/dL — ABNORMAL HIGH (ref 0.60–1.26)
Glucose, Bld: 212 mg/dL — ABNORMAL HIGH (ref 65–139)
Potassium: 4.2 mmol/L (ref 3.5–5.3)
Sodium: 136 mmol/L (ref 135–146)
eGFR: 67 mL/min/1.73m2 (ref >=60)

## 2024-06-07 ENCOUNTER — Ambulatory Visit: Payer: Self-pay | Admitting: Adult Health

## 2024-06-07 ENCOUNTER — Encounter: Payer: PRIVATE HEALTH INSURANCE | Admitting: Dietician

## 2024-06-07 NOTE — Progress Notes (Signed)
-    electrolytes normal

## 2024-06-12 ENCOUNTER — Encounter: Payer: PRIVATE HEALTH INSURANCE | Attending: Endocrinology | Admitting: Nutrition

## 2024-06-12 DIAGNOSIS — E109 Type 1 diabetes mellitus without complications: Secondary | ICD-10-CM | POA: Insufficient documentation

## 2024-06-12 NOTE — Progress Notes (Signed)
 Patient is wanting an insulin  pump.  We discussed how pump therapy works and he was shown the 4 different insulin  pumps.  WE discussed the advantages/disadvantages of each model and how the pumps use the sensor readings to adjust the flow of insulin , taking into account the IOB.  He is wanting a pump that he can remove from his body due to exercise.  He is interested in both the tandem Control IQ and the Beta bionic pump.  He was given a brochure of each pump, and told to go on line to learn more and call if questions.  He agreed to do this and to let me know which model he decides.  He is wanting to get this before the end of the year.  He had no final question

## 2024-06-12 NOTE — Patient Instructions (Signed)
 Read over information given and go on line to get more information on each pump. Call if questions Call to let us  know which pump you are wanting to get.

## 2024-06-18 NOTE — Telephone Encounter (Signed)
 LVM on my machine that he has decided to try the Ilet Beta Bionic pump.  I have filled out LMN and put on Dr. Eugenio desk for 2 signatures.  Please fat both pieces of paper to [phone]

## 2024-06-25 ENCOUNTER — Other Ambulatory Visit: Payer: Self-pay | Admitting: Adult Health

## 2024-06-25 DIAGNOSIS — E109 Type 1 diabetes mellitus without complications: Secondary | ICD-10-CM

## 2024-06-26 ENCOUNTER — Telehealth: Payer: Self-pay | Admitting: Nutrition

## 2024-06-26 NOTE — Telephone Encounter (Signed)
 Patient left message on my machine saying that he looked over information that I gave him on the different insulin  pumps and decided he wants the Ilet Beta bionic pump

## 2024-06-27 NOTE — Telephone Encounter (Signed)
 Paper work to start beta bionic insulin  pump sent to [employer] rep.

## 2024-08-06 ENCOUNTER — Other Ambulatory Visit: Payer: Self-pay | Admitting: Adult Health

## 2024-08-06 DIAGNOSIS — I10 Essential (primary) hypertension: Secondary | ICD-10-CM

## 2024-08-08 ENCOUNTER — Ambulatory Visit: Admitting: Endocrinology

## 2024-08-18 ENCOUNTER — Other Ambulatory Visit: Payer: Self-pay | Admitting: Adult Health

## 2024-08-18 ENCOUNTER — Encounter: Payer: Self-pay | Admitting: Adult Health

## 2024-08-18 DIAGNOSIS — E109 Type 1 diabetes mellitus without complications: Secondary | ICD-10-CM

## 2024-08-20 MED ORDER — DEXCOM G6 RECEIVER DEVI: 1.0000 | Freq: Every day | 11 refills | Status: DC

## 2024-08-20 MED ORDER — DEXCOM G6 SENSOR MISC: 1.0000 | Freq: Every day | 11 refills | Status: DC

## 2024-08-21 ENCOUNTER — Telehealth: Payer: Self-pay | Admitting: Dietician

## 2024-08-22 ENCOUNTER — Other Ambulatory Visit (HOSPITAL_COMMUNITY): Payer: Self-pay | Admitting: Registered Nurse

## 2024-08-22 ENCOUNTER — Telehealth (HOSPITAL_COMMUNITY): Payer: Self-pay | Admitting: *Deleted

## 2024-08-22 ENCOUNTER — Other Ambulatory Visit: Payer: Self-pay | Admitting: Endocrinology

## 2024-08-22 DIAGNOSIS — E109 Type 1 diabetes mellitus without complications: Secondary | ICD-10-CM

## 2024-08-22 DIAGNOSIS — F331 Major depressive disorder, recurrent, moderate: Secondary | ICD-10-CM

## 2024-08-22 MED ORDER — INSULIN ASPART 100 UNIT/ML IJ SOLN: INTRAMUSCULAR | 4 refills | Status: AC

## 2024-08-22 MED ORDER — BUPROPION HCL 75 MG PO TABS: 75.0000 mg | ORAL_TABLET | Freq: Two times a day (BID) | ORAL | 0 refills | Status: AC

## 2024-08-22 NOTE — Telephone Encounter (Signed)
 Patient pharmacy [facility] in [place] requesting refills for patient Bupropion  BID. Patient was last seen 04/2024 and was instructed to return in 3months. Patient was called to schedule f/u appt and staff was not able to reach patient. Message was left for patient to call office to schedule f/u appt.

## 2024-08-23 NOTE — Telephone Encounter (Signed)
 LMOM

## 2024-08-24 ENCOUNTER — Other Ambulatory Visit: Payer: Self-pay | Admitting: Adult Health

## 2024-08-24 ENCOUNTER — Telehealth: Payer: Self-pay

## 2024-08-24 DIAGNOSIS — E109 Type 1 diabetes mellitus without complications: Secondary | ICD-10-CM

## 2024-08-24 MED ORDER — FREESTYLE LIBRE 3 PLUS SENSOR MISC: 3 refills | Status: AC

## 2024-08-24 MED ORDER — DEXCOM G6 SENSOR MISC: 1.0000 | Freq: Every day | 11 refills | Status: AC

## 2024-08-24 NOTE — Telephone Encounter (Signed)
 Called patient and he requested for Freestyle Libre 3 to be sent to pharmacy, which was done.

## 2024-08-24 NOTE — Telephone Encounter (Signed)
 Copied from CRM #[id number]. Topic: Clinical - Prescription Issue >> Aug 24, 2024 10:37 AM Alfonso ORN wrote: Reason for CRM: call 3 times and not getting any response  regarding the Continuous Glucose Receiver (DEXCOM G6 RECEIVER) DEVI and  Continuous Glucose Sensor (DEXCOM G6 SENSOR) MISC pharmacy denied due to need a prior authorization and  if can not get approve asking if can get the   free style libre 3 which was on before , asking if can get by end of the day ,would need either by  tuesday since he have an appt with a dietician

## 2024-08-27 ENCOUNTER — Ambulatory Visit: Payer: PRIVATE HEALTH INSURANCE | Admitting: Endocrinology

## 2024-08-28 ENCOUNTER — Encounter: Payer: PRIVATE HEALTH INSURANCE | Admitting: Dietician
